# Patient Record
Sex: Male | Born: 1950
Health system: Southern US, Community
[De-identification: ages and names within clinical notes are randomized; demographics above are authoritative.]

## PROBLEM LIST (undated history)

## (undated) DIAGNOSIS — E119 Type 2 diabetes mellitus without complications: Secondary | ICD-10-CM

## (undated) DIAGNOSIS — R51 Headache: Secondary | ICD-10-CM

## (undated) DIAGNOSIS — Z9889 Other specified postprocedural states: Secondary | ICD-10-CM

## (undated) DIAGNOSIS — G4733 Obstructive sleep apnea (adult) (pediatric): Secondary | ICD-10-CM

## (undated) DIAGNOSIS — E785 Hyperlipidemia, unspecified: Secondary | ICD-10-CM

## (undated) DIAGNOSIS — L409 Psoriasis, unspecified: Secondary | ICD-10-CM

## (undated) DIAGNOSIS — H409 Unspecified glaucoma: Secondary | ICD-10-CM

## (undated) DIAGNOSIS — I2699 Other pulmonary embolism without acute cor pulmonale: Secondary | ICD-10-CM

## (undated) DIAGNOSIS — R253 Fasciculation: Secondary | ICD-10-CM

## (undated) DIAGNOSIS — K5790 Diverticulosis of intestine, part unspecified, without perforation or abscess without bleeding: Secondary | ICD-10-CM

## (undated) DIAGNOSIS — R519 Headache, unspecified: Secondary | ICD-10-CM

## (undated) DIAGNOSIS — Z9989 Dependence on other enabling machines and devices: Secondary | ICD-10-CM

## (undated) DIAGNOSIS — I10 Essential (primary) hypertension: Secondary | ICD-10-CM

## (undated) DIAGNOSIS — K219 Gastro-esophageal reflux disease without esophagitis: Secondary | ICD-10-CM

## (undated) DIAGNOSIS — I2510 Atherosclerotic heart disease of native coronary artery without angina pectoris: Secondary | ICD-10-CM

## (undated) HISTORY — DX: Other pulmonary embolism without acute cor pulmonale: I26.99

## (undated) HISTORY — DX: Headache: R51

## (undated) HISTORY — DX: Hyperlipidemia, unspecified: E78.5

## (undated) HISTORY — DX: Obstructive sleep apnea (adult) (pediatric): Z99.89

## (undated) HISTORY — DX: Psoriasis, unspecified: L40.9

## (undated) HISTORY — DX: Headache, unspecified: R51.9

## (undated) HISTORY — DX: Obstructive sleep apnea (adult) (pediatric): G47.33

## (undated) HISTORY — DX: Diverticulosis of intestine, part unspecified, without perforation or abscess without bleeding: K57.90

## (undated) HISTORY — DX: Other specified postprocedural states: Z98.890

## (undated) HISTORY — DX: Gastro-esophageal reflux disease without esophagitis: K21.9

## (undated) HISTORY — PX: APPENDECTOMY: SHX54

## (undated) HISTORY — DX: Fasciculation: R25.3

## (undated) HISTORY — DX: Atherosclerotic heart disease of native coronary artery without angina pectoris: I25.10

## (undated) HISTORY — DX: Essential (primary) hypertension: I10

## (undated) HISTORY — DX: Unspecified glaucoma: H40.9

## (undated) HISTORY — DX: Type 2 diabetes mellitus without complications: E11.9

---

## 1980-09-15 HISTORY — PX: SPINE SURGERY: SHX786

## 2004-09-15 HISTORY — PX: HERNIA REPAIR: SHX51

## 2005-06-03 ENCOUNTER — Emergency Department (HOSPITAL_COMMUNITY): Admission: EM | Admit: 2005-06-03 | Discharge: 2005-06-04 | Payer: Self-pay | Admitting: Emergency Medicine

## 2005-09-15 HISTORY — PX: COLON SURGERY: SHX602

## 2013-03-02 DIAGNOSIS — D490 Neoplasm of unspecified behavior of digestive system: Secondary | ICD-10-CM | POA: Insufficient documentation

## 2013-03-02 DIAGNOSIS — E785 Hyperlipidemia, unspecified: Secondary | ICD-10-CM | POA: Insufficient documentation

## 2013-03-02 DIAGNOSIS — R7301 Impaired fasting glucose: Secondary | ICD-10-CM | POA: Insufficient documentation

## 2013-03-02 DIAGNOSIS — Z8042 Family history of malignant neoplasm of prostate: Secondary | ICD-10-CM | POA: Insufficient documentation

## 2013-03-02 DIAGNOSIS — R252 Cramp and spasm: Secondary | ICD-10-CM | POA: Insufficient documentation

## 2013-03-02 DIAGNOSIS — L408 Other psoriasis: Secondary | ICD-10-CM | POA: Insufficient documentation

## 2013-03-28 ENCOUNTER — Encounter: Payer: Self-pay | Admitting: Neurology

## 2013-03-28 ENCOUNTER — Ambulatory Visit (INDEPENDENT_AMBULATORY_CARE_PROVIDER_SITE_OTHER): Payer: BC Managed Care – PPO | Admitting: Neurology

## 2013-03-28 VITALS — BP 139/83 | HR 59 | Ht 70.25 in | Wt 218.0 lb

## 2013-03-28 DIAGNOSIS — R252 Cramp and spasm: Secondary | ICD-10-CM

## 2013-03-28 DIAGNOSIS — G709 Myoneural disorder, unspecified: Secondary | ICD-10-CM

## 2013-03-28 DIAGNOSIS — L408 Other psoriasis: Secondary | ICD-10-CM

## 2013-03-28 DIAGNOSIS — R7301 Impaired fasting glucose: Secondary | ICD-10-CM

## 2013-03-28 DIAGNOSIS — G4730 Sleep apnea, unspecified: Secondary | ICD-10-CM

## 2013-03-28 DIAGNOSIS — R253 Fasciculation: Secondary | ICD-10-CM

## 2013-03-28 DIAGNOSIS — E785 Hyperlipidemia, unspecified: Secondary | ICD-10-CM

## 2013-03-28 DIAGNOSIS — I10 Essential (primary) hypertension: Secondary | ICD-10-CM

## 2013-03-28 DIAGNOSIS — Z8042 Family history of malignant neoplasm of prostate: Secondary | ICD-10-CM

## 2013-03-28 DIAGNOSIS — D490 Neoplasm of unspecified behavior of digestive system: Secondary | ICD-10-CM

## 2013-03-28 HISTORY — DX: Fasciculation: R25.3
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
## 2013-03-29 ENCOUNTER — Telehealth: Payer: Self-pay | Admitting: Neurology

## 2013-03-29 LAB — MAGNESIUM: Magnesium: 2.2 mg/dL (ref 1.6–2.6)

## 2013-03-29 LAB — GAD-65 AUTOANTIBODY: Glutamic Acid Decarb Ab: <1 U/mL (ref 0.0–1.5)

## 2013-03-29 LAB — LYME, TOTAL AB TEST/REFLEX: Lyme Ab: <0.91 index (ref 0.00–0.90)

## 2013-03-29 LAB — CK: Total CK: 219 U/L — ABNORMAL HIGH (ref 24–204)

## 2013-03-29 LAB — ANGIOTENSIN CONVERTING ENZYME: Angio Convert Enzyme: 14 U/L (ref 14–82)

## 2013-03-29 NOTE — Telephone Encounter (Signed)
I called patient. The blood work is unremarkable with exception of a very minimal elevation of CK enzyme levels. The patient has been set up for EMG and nerve conduction study.

## 2013-04-12 ENCOUNTER — Encounter (INDEPENDENT_AMBULATORY_CARE_PROVIDER_SITE_OTHER): Payer: BC Managed Care – PPO

## 2013-04-12 ENCOUNTER — Ambulatory Visit (INDEPENDENT_AMBULATORY_CARE_PROVIDER_SITE_OTHER): Payer: BC Managed Care – PPO | Admitting: Neurology

## 2013-04-12 DIAGNOSIS — R253 Fasciculation: Secondary | ICD-10-CM

## 2013-04-12 DIAGNOSIS — G709 Myoneural disorder, unspecified: Secondary | ICD-10-CM

## 2013-04-12 DIAGNOSIS — Z0289 Encounter for other administrative examinations: Secondary | ICD-10-CM

## 2013-04-12 MED ORDER — BACLOFEN 10 MG PO TABS: 10.0000 mg | ORAL_TABLET | Freq: Every day | ORAL | Status: DC

## 2013-04-12 NOTE — Progress Notes (Signed)
The patient comes in today for EMG and nerve conduction study. This reveals evidence of a right carpal tunnel syndrome, no evidence of muscle abnormalities. The patient had ongoing cramps day and night, worse at night. The patient will start baclofen at 10 mg at night. We will readjust the dose depending on his needs. The patient will followup in 3 or 4 months.

## 2013-04-12 NOTE — Procedures (Signed)
HISTORY:  Corey Gill is a 62 year old gentleman with a history of muscle cramps dating back about 3 years affecting the legs greater than the arms and chest area. The cramps are daily in nature, worse at night. The patient has also noted fasciculations of the muscle, primarily in the calf muscles. The patient is being evaluated for these symptoms.  NERVE CONDUCTION STUDIES:  Nerve conduction studies were performed on both upper extremities and on the right lower extremity. The distal motor latencies for the median nerves were prolonged on the right, normal on the left, with normal nerve conduction velocities for these nerves. The distal motor latency and motor amplitudes for the right ulnar nerve was normal. The F wave latencies and nerve conduction velocities for the median nerves bilaterally and for the right ulnar nerve were normal. The sensory latencies for the median nerves were prolonged on the right, normal on the left, and normal for the right ulnar nerve.  Nerve conduction studies were performed on the right lower extremity. The distal motor latencies and motor amplitudes for the peroneal and posterior tibial nerves were within normal limits. The nerve conduction velocities for these nerves were also normal. The H reflex latency was normal. The sensory latency for the peroneal nerve was within normal limits.   EMG STUDIES:  EMG study was performed on the right upper extremity:  The first dorsal interosseous muscle reveals 2 to 5 K units with full recruitment. No fibrillations or positive waves were noted. The abductor pollicis brevis muscle reveals 2 to 5 K units with decreased recruitment. No fibrillations or positive waves were noted. The extensor indicis proprius muscle reveals 1 to 3 K units with full recruitment. No fibrillations or positive waves were noted. The pronator teres muscle reveals 2 to 3 K units with full recruitment. No fibrillations or positive waves were  noted. The biceps muscle reveals 1 to 2 K units with full recruitment. No fibrillations or positive waves were noted. The triceps muscle reveals 2 to 3 K units with full recruitment. No fibrillations or positive waves were noted. The anterior deltoid muscle reveals 2 to 3 K units with full recruitment. No fibrillations or positive waves were noted. The cervical paraspinal muscles were tested at 2 levels. No abnormalities of insertional activity were seen at either level tested. There was fair relaxation.  EMG study was performed on the right lower extremity:  The tibialis anterior muscle reveals 2 to 4K motor units with full recruitment. No fibrillations or positive waves were seen. The peroneus tertius muscle reveals 2 to 4K motor units with full recruitment. No fibrillations or positive waves were seen. The medial gastrocnemius muscle reveals 1 to 3K motor units with full recruitment. No fibrillations or positive waves were seen. The vastus lateralis muscle reveals 2 to 4K motor units with full recruitment. No fibrillations or positive waves were seen. The iliopsoas muscle reveals 2 to 4K motor units with full recruitment. No fibrillations or positive waves were seen. The biceps femoris muscle (long head) reveals 2 to 4K motor units with full recruitment. No fibrillations or positive waves were seen. The lumbosacral paraspinal muscles were tested at 3 levels, and revealed no abnormalities of insertional activity at all 3 levels tested. There was good relaxation.   IMPRESSION:  Nerve conduction studies done on both upper extremities and on the right lower extremity revealed evidence of a mild right carpal tunnel syndrome. There is no evidence of an underlying peripheral neuropathy. EMG evaluation of the right upper and  right lower extremities were unremarkable, without evidence of a cervical or a lumbosacral radiculopathy on the right. No evidence of intrinsic muscle disease is seen.  Fasciculations were not seen in any muscle tested.  Marlan Palau MD 04/12/2013 1:41 PM  [facility] [address] [place], [place] [address]  Phone [phone] Fax [phone]

## 2013-08-21 ENCOUNTER — Other Ambulatory Visit: Payer: Self-pay | Admitting: Neurology

## 2013-08-27 ENCOUNTER — Encounter: Payer: Self-pay | Admitting: Cardiology

## 2013-08-29 ENCOUNTER — Ambulatory Visit (INDEPENDENT_AMBULATORY_CARE_PROVIDER_SITE_OTHER): Payer: BC Managed Care – PPO | Admitting: Cardiology

## 2013-08-29 ENCOUNTER — Encounter: Payer: Self-pay | Admitting: Cardiology

## 2013-08-29 VITALS — BP 148/79 | HR 70 | Ht 70.0 in | Wt 222.0 lb

## 2013-08-29 DIAGNOSIS — I10 Essential (primary) hypertension: Secondary | ICD-10-CM | POA: Insufficient documentation

## 2013-08-29 DIAGNOSIS — G4733 Obstructive sleep apnea (adult) (pediatric): Secondary | ICD-10-CM

## 2013-08-29 DIAGNOSIS — Z9989 Dependence on other enabling machines and devices: Secondary | ICD-10-CM

## 2013-08-29 DIAGNOSIS — E669 Obesity, unspecified: Secondary | ICD-10-CM | POA: Insufficient documentation

## 2013-08-29 NOTE — Patient Instructions (Signed)
Your physician recommends that you continue on your current medications as directed. Please refer to the Current Medication list given to you today.  Your physician wants you to follow-up in: 6 Months with Dr Turner You will receive a reminder letter in the mail two months in advance. If you don't receive a letter, please call our office to schedule the follow-up appointment.  

## 2013-08-29 NOTE — Progress Notes (Signed)
[address] [place], [place]  [address] Phone: [phone] Fax:  [phone]  Date:  08/29/2013   ID:  Corey Gill, DOB October 14, 1950, MRN [id number]  PCP:  Mickie Hillier, MD  Sleep Medicine:  Armanda Magic, MD   History of Present Illness: Corey Gill is a 62 y.o. male with a history of OSA, HTN and obesity presents today for followup.  He is doing well.  He tolerates his device without problems.  He tolerates his mask and feels with pressure is adequate.  He feels rested in the am and has some afternoon sleepiness.  He does not get any aerobic exercise.   Wt Readings from Last 3 Encounters:  08/29/13 222 lb (100.699 kg)  03/28/13 218 lb (98.884 kg)  03/28/13 219 lb 12.8 oz (99.701 kg)     Past Medical History  Diagnosis Date  . GERD (gastroesophageal reflux disease)   . Diverticulosis   . Headache   . Psoriasis   . OSA on CPAP   . History of colonoscopy   . 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 Glaucoma     Bilateral  . Essential hypertension     Current Outpatient Prescriptions  Medication Sig Dispense Refill  . acetaminophen (TYLENOL) 500 MG tablet Take 500 mg by mouth every 6 (six) hours as needed for pain.      Marland Kitchen amLODipine (NORVASC) 2.5 MG tablet Take 2.5 mg by mouth daily.      Marland Kitchen augmented betamethasone dipropionate (DIPROLENE-AF) 0.05 % cream Apply topically daily.      . baclofen (LIORESAL) 10 MG tablet TAKE 1 TABLET BY MOUTH AT BEDTIME  30 tablet  1  . lisinopril (PRINIVIL,ZESTRIL) 40 MG tablet Take 40 mg by mouth daily.      Marland Kitchen LUMIGAN 0.01 % SOLN Place 0.01 drops into both eyes daily.      . Multiple Vitamin (MULTIVITAMIN) tablet Take 1 tablet by mouth daily.      Marland Kitchen omeprazole (PRILOSEC) 20 MG capsule Take 20 mg by mouth daily.      . timolol (TIMOPTIC) 0.5 % ophthalmic solution Place 0.5 drops into both eyes daily.        No current facility-administered medications for this visit.    Allergies:    Allergies  Allergen  Reactions  . Asa [Aspirin] Swelling    Social History:  The patient  reports that he quit smoking about 19 years ago. He does not have any smokeless tobacco history on file. He reports that he does not drink alcohol or use illicit drugs.   Family History:  The patient's family history includes Angina in his sister; Colon polyps in his mother; GER disease in his mother; Prostate cancer in his brother and father; Skin cancer in his mother.   ROS:  Please see the history of present illness.      All other systems reviewed and negative.   PHYSICAL EXAM: VS:  BP 148/79  Pulse 70  Ht 5\' 10"  (1.778 m)  Wt 222 lb (100.699 kg)  BMI 31.85 kg/m2 Well nourished, well developed, in no acute distress HEENT: normal Neck: no JVD Cardiac:  normal S1, S2; RRR; no murmur Lungs:  clear to auscultation bilaterally, no wheezing, rhonchi or rales Abd: soft, nontender, no hepatomegaly Ext: no edema Skin: warm and dry Neuro:  CNs 2-12 intact, no focal abnormalities noted       ASSESSMENT AND PLAN:  1. OSA on CPAP therapy - download today showed an AHI of  1.3/hr and 80% compliant in using more than 4 hours nightly.  He will continu eon 7cm H2O 2. HTN - slightly elevated - at home it runs 120-14096mmHg  - continue amlodipine/lisinopril 3. Obesity - I encouraged him to get into an aerobic exercise regimen  followup with me in 6 months  Signed, Armanda Magic, MD 08/29/2013 3:52 PM

## 2014-02-28 ENCOUNTER — Encounter: Payer: Self-pay | Admitting: Cardiology

## 2014-03-01 ENCOUNTER — Encounter: Payer: Self-pay | Admitting: Cardiology

## 2014-03-01 ENCOUNTER — Encounter (INDEPENDENT_AMBULATORY_CARE_PROVIDER_SITE_OTHER): Payer: Self-pay

## 2014-03-01 ENCOUNTER — Ambulatory Visit (INDEPENDENT_AMBULATORY_CARE_PROVIDER_SITE_OTHER): Payer: BC Managed Care – PPO | Admitting: Cardiology

## 2014-03-01 VITALS — BP 130/76 | HR 81 | Ht 70.0 in | Wt 224.8 lb

## 2014-03-01 DIAGNOSIS — R001 Bradycardia, unspecified: Secondary | ICD-10-CM | POA: Insufficient documentation

## 2014-03-01 DIAGNOSIS — I10 Essential (primary) hypertension: Secondary | ICD-10-CM

## 2014-03-01 DIAGNOSIS — E669 Obesity, unspecified: Secondary | ICD-10-CM

## 2014-03-01 DIAGNOSIS — G4733 Obstructive sleep apnea (adult) (pediatric): Secondary | ICD-10-CM

## 2014-03-01 DIAGNOSIS — Z9989 Dependence on other enabling machines and devices: Secondary | ICD-10-CM

## 2014-03-01 DIAGNOSIS — I498 Other specified cardiac arrhythmias: Secondary | ICD-10-CM

## 2014-03-01 NOTE — Patient Instructions (Addendum)
Your physician recommends that you continue on your current medications as directed. Please refer to the Current Medication list given to you today.  Your physician has recommended that you wear a holter monitor. Holter monitors are medical devices that record the heart's electrical activity. Doctors most often use these monitors to diagnose arrhythmias. Arrhythmias are problems with the speed or rhythm of the heartbeat. The monitor is a small, portable device. You can wear one while you do your normal daily activities. This is usually used to diagnose what is causing palpitations/syncope (passing out). (48 hour monitor)   Your physician wants you to follow-up in: 6 months with Dr Mallie Snooks will receive a reminder letter in the mail two months in advance. If you don't receive a letter, please call our office to schedule the follow-up appointment.

## 2014-03-01 NOTE — Progress Notes (Signed)
Corey Gill, [place], [place]  [address] Phone: [phone] Fax:  [phone]  Date:  03/01/2014   ID:  Corey Gill, DOB 05/13/1951, MRN [id number]  PCP:  Gennette Pac, MD  Cardiologist:  Fransico Him, MD     History of Present Illness: Corey Gill is a 63 y.o. male with a history of OSA, HTN and obesity presents today for followup. He is doing well. He tolerates his device without problems. He tolerates his mask and feels with pressure is adequate. He feels rested in the am and has some afternoon sleepiness. He does not get any aerobic exercise. He has been fatigued recently and has noticed that his HR has been occasionally running in the low 50's and upper 40's.   Wt Readings from Last 3 Encounters:  03/01/14 224 lb 12.8 oz (101.969 kg)  08/29/13 222 lb (100.699 kg)  03/28/13 218 lb (98.884 kg)     Past Medical History  Diagnosis Date  . GERD (gastroesophageal reflux disease)   . Diverticulosis   . Headache   . Psoriasis   . OSA on CPAP   . History of colonoscopy   . 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 Glaucoma     Bilateral  . Essential hypertension     Current Outpatient Prescriptions  Medication Sig Dispense Refill  . acetaminophen (TYLENOL) 500 MG tablet Take 500 mg by mouth every 6 (six) hours as needed for pain.      . ALPHAGAN P 0.1 % SOLN       . amLODipine (NORVASC) 2.5 MG tablet Take 2.5 mg by mouth daily.      Marland Kitchen augmented betamethasone dipropionate (DIPROLENE-AF) 0.05 % cream Apply topically daily.      Marland Kitchen lisinopril (PRINIVIL,ZESTRIL) 40 MG tablet Take 40 mg by mouth daily.      Marland Kitchen LUMIGAN 0.01 % SOLN Place 0.01 drops into both eyes daily.      Marland Kitchen omeprazole (PRILOSEC) 20 MG capsule Take 20 mg by mouth daily.       No current facility-administered medications for this visit.    Allergies:    Allergies  Allergen Reactions  . Asa [Aspirin] Swelling    Social History:  The patient  reports that he quit smoking  about 20 years ago. He does not have any smokeless tobacco history on file. He reports that he does not drink alcohol or use illicit drugs.   Family History:  The patient's family history includes Angina in his sister; Colon polyps in his mother; GER disease in his mother; Prostate cancer in his brother and father; Skin cancer in his mother.   ROS:  Please see the history of present illness.      All other systems reviewed and negative.   PHYSICAL EXAM: VS:  BP 130/76  Pulse 81  Ht 5\' 10"  (1.778 m)  Wt 224 lb 12.8 oz (101.969 kg)  BMI 32.26 kg/m2 Well nourished, well developed, in no acute distress HEENT: normal Neck: no JVD Cardiac:  normal S1, S2; RRR; no murmur Lungs:  clear to auscultation bilaterally, no wheezing, rhonchi or rales Abd: soft, nontender, no hepatomegaly Ext: no edema Skin: warm and dry Neuro:  CNs 2-12 intact, no focal abnormalities noted  ASSESSMENT AND PLAN:  1. OSA on CPAP therapy - download today showed an AHI of 1.1/hr and 69% compliant in using more than 4 hours nightly. He will continue on 7cm H2O 2. HTN - slightly elevated - at home it runs 120-14024mmHg -  continue amlodipine/lisinopril  3. Obesity - I encouraged him to get into an aerobic exercise regimen followup with me in 6 months       4.  Bradycardia - his HR is fine today but given his intermittent fatigue with intermittent low HR will assess with 48 hour Holter monitor  Followup with me in 6 months   Signed, Fransico Him, MD 03/01/2014 3:25 PM

## 2014-03-07 ENCOUNTER — Encounter: Payer: Self-pay | Admitting: *Deleted

## 2014-03-07 ENCOUNTER — Encounter (INDEPENDENT_AMBULATORY_CARE_PROVIDER_SITE_OTHER): Payer: BC Managed Care – PPO

## 2014-03-07 DIAGNOSIS — I498 Other specified cardiac arrhythmias: Secondary | ICD-10-CM

## 2014-03-07 DIAGNOSIS — R001 Bradycardia, unspecified: Secondary | ICD-10-CM

## 2014-03-07 NOTE — Progress Notes (Signed)
Patient ID: Corey Gill, male   DOB: [birth year], 63 y.o.   MRN: [id number] E-Cardio 48 hour holter monitor applied to patient.

## 2014-03-13 ENCOUNTER — Telehealth: Payer: Self-pay | Admitting: Cardiology

## 2014-03-13 NOTE — Telephone Encounter (Signed)
Pt is aware.  

## 2014-03-13 NOTE — Telephone Encounter (Signed)
Please let patient know that heart monitor showed NSR with occasional extra beats from the top of her heart which are normal.

## 2014-05-30 ENCOUNTER — Encounter: Payer: Self-pay | Admitting: Cardiology

## 2014-06-22 ENCOUNTER — Encounter: Payer: Self-pay | Admitting: Cardiology

## 2014-06-23 ENCOUNTER — Encounter: Payer: Self-pay | Admitting: Cardiology

## 2014-08-28 ENCOUNTER — Ambulatory Visit (INDEPENDENT_AMBULATORY_CARE_PROVIDER_SITE_OTHER): Payer: BC Managed Care – PPO | Admitting: Cardiology

## 2014-08-28 ENCOUNTER — Encounter: Payer: Self-pay | Admitting: Cardiology

## 2014-08-28 VITALS — BP 124/72 | HR 70 | Ht 70.0 in | Wt 224.2 lb

## 2014-08-28 DIAGNOSIS — G4733 Obstructive sleep apnea (adult) (pediatric): Secondary | ICD-10-CM

## 2014-08-28 DIAGNOSIS — Z9989 Dependence on other enabling machines and devices: Secondary | ICD-10-CM

## 2014-08-28 DIAGNOSIS — E669 Obesity, unspecified: Secondary | ICD-10-CM

## 2014-08-28 DIAGNOSIS — I10 Essential (primary) hypertension: Secondary | ICD-10-CM

## 2014-08-28 NOTE — Progress Notes (Signed)
[address] [place], [place]  [address] Phone: [phone] Fax:  [phone]  Date:  08/28/2014   ID:  Corey Gill, DOB 1950/10/24, MRN [id number]  PCP:  Gennette Pac, MD  Cardiologist:  Fransico Him, MD    History of Present Illness: Corey Gill is a 63 y.o. male with a history of OSA, HTN and obesity presents today for followup. He is doing well. He tolerates his device without problems. He tolerates his nasal pillow mask and feels with pressure is adequate. He feels rested in the am but does feel sleepy later in the day.  He goes to bed anywhere from 11pm to 1am. He does not get much aerobic exercise.   Wt Readings from Last 3 Encounters:  08/28/14 224 lb 3.2 oz (101.696 kg)  03/01/14 224 lb 12.8 oz (101.969 kg)  08/29/13 222 lb (100.699 kg)     Past Medical History  Diagnosis Date  . GERD (gastroesophageal reflux disease)   . Diverticulosis   . Headache   . Psoriasis   . OSA on CPAP   . History of colonoscopy   . 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 Glaucoma     Bilateral  . Essential hypertension     Current Outpatient Prescriptions  Medication Sig Dispense Refill  . acetaminophen (TYLENOL) 500 MG tablet Take 500 mg by mouth every 8 (eight) hours as needed (migraines). Pt states he takes this just a few times a year    . ALPHAGAN P 0.1 % SOLN Place 1 drop into both eyes 2 (two) times daily.     Marland Kitchen amLODipine (NORVASC) 2.5 MG tablet Take 2.5 mg by mouth daily.    Marland Kitchen augmented betamethasone dipropionate (DIPROLENE-AF) 0.05 % cream Apply 1 application topically daily.     Marland Kitchen lisinopril (PRINIVIL,ZESTRIL) 40 MG tablet Take 40 mg by mouth daily.    Marland Kitchen LUMIGAN 0.01 % SOLN Place 1 drop into both eyes at bedtime.     Marland Kitchen omeprazole (PRILOSEC) 20 MG capsule Take 20 mg by mouth daily.     No current facility-administered medications for this visit.    Allergies:    Allergies  Allergen Reactions  . Rosuvastatin Other (See Comments)   Tired and severe muscle aches  . Asa [Aspirin] Swelling    Social History:  The patient  reports that he quit smoking about 20 years ago. He does not have any smokeless tobacco history on file. He reports that he does not drink alcohol or use illicit drugs.   Family History:  The patient's family history includes Angina in his sister; Colon polyps in his mother; GER disease in his mother; Prostate cancer in his brother and father; Skin cancer in his mother.   ROS:  Please see the history of present illness.      All other systems reviewed and negative.   PHYSICAL EXAM: VS:  BP 124/72 mmHg  Pulse 70  Ht 5\' 10"  (1.778 m)  Wt 224 lb 3.2 oz (101.696 kg)  BMI 32.17 kg/m2 Well nourished, well developed, in no acute distress HEENT: normal Neck: no JVD Cardiac:  normal S1, S2; RRR; no murmur Lungs:  clear to auscultation bilaterally, no wheezing, rhonchi or rales Abd: soft, nontender, no hepatomegaly Ext: no edema Skin: warm and dry Neuro:  CNs 2-12 intact, no focal abnormalities noted  ASSESSMENT AND PLAN:  1. OSA on CPAP therapy - download today showed an AHI of 1.4/hr and 87% compliant in using more than 4 hours  nightly. He will continue on 7cm H2O 2. HTN - controlled - continue amlodipine/lisinopril  3. Obesity - I encouraged him to get into an aerobic exercise regimen followup with me in 6 months  Followup with me in 6 months        Signed, Fransico Him, MD [facility] 08/28/2014 9:24 AM

## 2014-08-28 NOTE — Patient Instructions (Signed)
Your physician recommends that you continue on your current medications as directed. Please refer to the Current Medication list given to you today.  Your physician wants you to follow-up in: 6 months with Dr Turner You will receive a reminder letter in the mail two months in advance. If you don't receive a letter, please call our office to schedule the follow-up appointment.  

## 2014-11-21 ENCOUNTER — Encounter: Payer: Self-pay | Admitting: *Deleted

## 2014-11-21 ENCOUNTER — Encounter: Payer: BLUE CROSS/BLUE SHIELD | Attending: Family Medicine | Admitting: *Deleted

## 2014-11-21 VITALS — Ht 70.0 in | Wt 217.1 lb

## 2014-11-21 DIAGNOSIS — Z713 Dietary counseling and surveillance: Secondary | ICD-10-CM | POA: Insufficient documentation

## 2014-11-21 DIAGNOSIS — E119 Type 2 diabetes mellitus without complications: Secondary | ICD-10-CM | POA: Diagnosis not present

## 2014-11-21 NOTE — Patient Instructions (Signed)
Plan:  Aim for 3 Carb Choices per meal (45 grams) +/- 1 either way  Aim for 0-15 Carbs per snack if hungry  Include protein in moderation with your meals and snacks Consider reading food labels for Total Carbohydrate and Fat Grams of foods Consider  increasing your activity level by walking for 30 minutes daily as tolerated Continue taking medication as directed by MD  Consider Lynnae Sandhoff 45-50 calorie bread ( 9-10g carbs)  Always have three meals per day, consider snacks Take Metformin in the middle of your breakfast

## 2014-11-23 ENCOUNTER — Encounter: Payer: Self-pay | Admitting: Cardiology

## 2014-11-23 ENCOUNTER — Encounter: Payer: Self-pay | Admitting: *Deleted

## 2014-11-23 NOTE — Progress Notes (Signed)
Diabetes Self-Management Education  Visit Type:   Initial DSME  Appt. Start Time: 1400 Appt. End Time: 1600  11/23/2014  Mr. Sahan Pen, identified by name and date of birth, is a 64 y.o. male with a diagnosis of Diabetes: Type 2.  Other people present during visit:  Spouse/SO Birder Robson) Mr and Mrs Conover present for initial DSME. Mr. Esther Hardy has a flat affect and hangs his head. He is most disturbed with the new diagnosis of T2DM. He notes he does not adjust to change well.  ASSESSMENT  Height 5\' 10"  (1.778 m), weight 217 lb 1.6 oz (98.476 kg). Body mass index is 31.15 kg/(m^2).  Initial Visit Information:  Are you currently following a meal plan?: No Are you taking your medications as prescribed?: Yes Are you checking your feet?: No How often do you need to have someone help you when you read instructions, pamphlets, or other written materials from your doctor or pharmacy?: 1 - Never   Psychosocial:   Patient Belief/Attitude about Diabetes: Motivated to manage diabetes Self-care barriers: None Self-management support: Doctor's office, Family, CDE visits Other persons present: Spouse/SO (Allene) Patient Concerns: Nutrition/Meal planning, Medication, Healthy Lifestyle, Weight Control Special Needs: None Preferred Learning Style: No preference indicated Learning Readiness: Change in progress  Complications:   Last HgB A1C per patient/outside source: 9.2 mg/dL How often do you check your blood sugar?: Not recommended by provider Have you had a dilated eye exam in the past 12 months?: Yes Have you had a dental exam in the past 12 months?: No  Diet Intake:  Breakfast: 1C Special K cereal, banana, 1% milk / 2 scrambled eggs, 2 bacon, 1-2 toast Lunch: skip lunch 3-4 times per week / 2 hot dogs water, chili & mustard Snack (afternoon): little debbie raisin cake Dinner: broiled talapia, vegetable soup, salad (tomatoe, greens, ranch dressing),  Snack (evening): little debbie  raisin cake (only one per day) Beverage(s): coffee, sweet tea (sugar)  Exercise:  Exercise: ADL's  Individualized Plan for Diabetes Self-Management Training:   Learning Objective:  Patient will have a greater understanding of diabetes self-management. Patient education plan per assessed needs and concerns is to attend individual sessions      Education Topics Reviewed with Patient Today:  Definition of diabetes, type 1 and 2, and the diagnosis of diabetes, Factors that contribute to the development of diabetes, Explored patient's options for treatment of their diabetes Role of exercise on diabetes management, blood pressure control and cardiac health. Reviewed patients medication for diabetes, action, purpose, timing of dose and side effects. Relationship between chronic complications and blood glucose control, Assessed and discussed foot care and prevention of foot problems, Dental care  PATIENTS GOALS/Plan (Developed by the patient):  Nutrition: General guidelines for healthy choices and portions discussed Physical Activity: Exercise 3-5 times per week Medications: take my medication as prescribed Reducing Risk: do foot checks daily   Patient Instructions  Plan:  Aim for 3 Carb Choices per meal (45 grams) +/- 1 either way  Aim for 0-15 Carbs per snack if hungry  Include protein in moderation with your meals and snacks Consider reading food labels for Total Carbohydrate and Fat Grams of foods Consider  increasing your activity level by walking for 30 minutes daily as tolerated Continue taking medication as directed by MD  Consider Lynnae Sandhoff 45-50 calorie bread ( 9-10g carbs)  Always have three meals per day, consider snacks Take Metformin in the middle of your breakfast  Expected Outcomes:  Demonstrated interest in learning.  Expect positive outcomes  Education material provided: Living Well with Diabetes, Food label handouts, A1C conversion sheet, Meal plan card, My Plate  and Snack sheet  If problems or questions, patient to contact team via:  Phone  Future DSME appointment: 4-6 wks

## 2015-02-23 ENCOUNTER — Ambulatory Visit: Payer: BLUE CROSS/BLUE SHIELD | Admitting: Cardiology

## 2015-03-16 ENCOUNTER — Telehealth: Payer: Self-pay | Admitting: Cardiology

## 2015-03-21 ENCOUNTER — Ambulatory Visit: Payer: BLUE CROSS/BLUE SHIELD | Admitting: Nurse Practitioner

## 2015-04-11 ENCOUNTER — Ambulatory Visit: Payer: BLUE CROSS/BLUE SHIELD | Admitting: Cardiology

## 2015-05-24 ENCOUNTER — Ambulatory Visit (INDEPENDENT_AMBULATORY_CARE_PROVIDER_SITE_OTHER): Payer: BLUE CROSS/BLUE SHIELD | Admitting: Cardiology

## 2015-05-24 ENCOUNTER — Encounter: Payer: Self-pay | Admitting: Cardiology

## 2015-05-24 VITALS — BP 120/58 | HR 77 | Ht 70.0 in | Wt 200.0 lb

## 2015-05-24 DIAGNOSIS — G4733 Obstructive sleep apnea (adult) (pediatric): Secondary | ICD-10-CM | POA: Diagnosis not present

## 2015-05-24 DIAGNOSIS — E669 Obesity, unspecified: Secondary | ICD-10-CM | POA: Diagnosis not present

## 2015-05-24 DIAGNOSIS — Z9989 Dependence on other enabling machines and devices: Secondary | ICD-10-CM

## 2015-05-24 DIAGNOSIS — I10 Essential (primary) hypertension: Secondary | ICD-10-CM
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
## 2015-05-24 NOTE — Patient Instructions (Signed)

## 2015-06-12 ENCOUNTER — Encounter: Payer: Self-pay | Admitting: Cardiology

## 2015-09-06 NOTE — Telephone Encounter (Signed)
error 

## 2016-05-16 ENCOUNTER — Encounter: Payer: Self-pay | Admitting: Cardiology

## 2016-05-28 ENCOUNTER — Encounter: Payer: Self-pay | Admitting: Cardiology
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
## 2016-06-01 ENCOUNTER — Encounter: Payer: Self-pay | Admitting: Cardiology

## 2016-06-02 ENCOUNTER — Encounter: Payer: Self-pay | Admitting: Cardiology

## 2016-06-02 ENCOUNTER — Encounter (INDEPENDENT_AMBULATORY_CARE_PROVIDER_SITE_OTHER): Payer: Self-pay

## 2016-06-02 ENCOUNTER — Ambulatory Visit (INDEPENDENT_AMBULATORY_CARE_PROVIDER_SITE_OTHER): Payer: BLUE CROSS/BLUE SHIELD | Admitting: Cardiology

## 2016-06-02 VITALS — BP 140/70 | HR 65 | Ht 70.0 in | Wt 210.4 lb

## 2016-06-02 DIAGNOSIS — G4733 Obstructive sleep apnea (adult) (pediatric): Secondary | ICD-10-CM

## 2016-06-02 DIAGNOSIS — E669 Obesity, unspecified: Secondary | ICD-10-CM

## 2016-06-02 DIAGNOSIS — I10 Essential (primary) hypertension: Secondary | ICD-10-CM

## 2016-06-02 DIAGNOSIS — Z9989 Dependence on other enabling machines and devices: Secondary | ICD-10-CM

## 2016-06-02 NOTE — Patient Instructions (Signed)
Medication Instructions:  None  Labwork: None  Testing/Procedures: None  Follow-Up: Your physician wants you to follow-up in: 1 year with Dr. Turner. You will receive a reminder letter in the mail two months in advance. If you don't receive a letter, please call our office to schedule the follow-up appointment.   Any Other Special Instructions Will Be Listed Below (If Applicable).    If you need a refill on your cardiac medications before your next appointment, please call your pharmacy.   

## 2016-08-19 DIAGNOSIS — L400 Psoriasis vulgaris: Secondary | ICD-10-CM | POA: Diagnosis not present

## 2016-08-25 DIAGNOSIS — H401131 Primary open-angle glaucoma, bilateral, mild stage: Secondary | ICD-10-CM | POA: Diagnosis not present

## 2016-11-24 DIAGNOSIS — H401131 Primary open-angle glaucoma, bilateral, mild stage: Secondary | ICD-10-CM | POA: Diagnosis not present

## 2017-02-23 DIAGNOSIS — H401131 Primary open-angle glaucoma, bilateral, mild stage: Secondary | ICD-10-CM | POA: Diagnosis not present

## 2017-03-05 DIAGNOSIS — Z8042 Family history of malignant neoplasm of prostate: Secondary | ICD-10-CM | POA: Diagnosis not present

## 2017-03-05 DIAGNOSIS — I10 Essential (primary) hypertension: Secondary | ICD-10-CM | POA: Diagnosis not present

## 2017-03-05 DIAGNOSIS — E119 Type 2 diabetes mellitus without complications: Secondary | ICD-10-CM | POA: Diagnosis not present

## 2017-03-05 DIAGNOSIS — G4730 Sleep apnea, unspecified: Secondary | ICD-10-CM | POA: Diagnosis not present

## 2017-03-10 ENCOUNTER — Encounter: Payer: Self-pay | Admitting: Neurology
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
## 2017-03-24 ENCOUNTER — Encounter: Payer: Self-pay | Admitting: Neurology

## 2017-03-24 ENCOUNTER — Ambulatory Visit (INDEPENDENT_AMBULATORY_CARE_PROVIDER_SITE_OTHER): Payer: Medicare Other | Admitting: Neurology

## 2017-03-24 ENCOUNTER — Other Ambulatory Visit: Payer: Medicare Other

## 2017-03-24 VITALS — BP 134/74 | HR 56 | Ht 69.5 in | Wt 202.0 lb

## 2017-03-24 DIAGNOSIS — R270 Ataxia, unspecified: Secondary | ICD-10-CM | POA: Diagnosis not present

## 2017-03-24 DIAGNOSIS — R6889 Other general symptoms and signs: Secondary | ICD-10-CM | POA: Diagnosis not present

## 2017-03-24 DIAGNOSIS — M791 Myalgia, unspecified site: Secondary | ICD-10-CM

## 2017-03-24 DIAGNOSIS — M542 Cervicalgia: Secondary | ICD-10-CM

## 2017-03-24 DIAGNOSIS — Z9889 Other specified postprocedural states: Secondary | ICD-10-CM

## 2017-03-24 DIAGNOSIS — M6281 Muscle weakness (generalized): Secondary | ICD-10-CM

## 2017-03-24 DIAGNOSIS — R51 Headache: Secondary | ICD-10-CM

## 2017-03-24 DIAGNOSIS — R413 Other amnesia: Secondary | ICD-10-CM | POA: Diagnosis not present

## 2017-03-24 DIAGNOSIS — R292 Abnormal reflex: Secondary | ICD-10-CM | POA: Diagnosis not present

## 2017-03-24 DIAGNOSIS — R519 Headache, unspecified: Secondary | ICD-10-CM

## 2017-03-24 NOTE — Patient Instructions (Addendum)
1. We have sent a referral to [facility] for your MRI and they will call you directly to schedule your appt. They are located at [place]. If you need to contact them directly please call [phone].  2. Your provider has requested that you have labwork completed today. Please go to [facility] ([id number]) on the second floor of this building before leaving the office today. You do not need to check in. If you are not called within 15 minutes please check with the front desk.   3. We will schedule EEG.  Avoid activities in which a spell would cause danger to yourself or to others.  Don't operate dangerous machinery, swim alone, or climb in high or dangerous places, such as on ladders, roofs, or girders.  Do not drive.

## 2017-03-25 LAB — LYME AB/WESTERN BLOT REFLEX

## 2017-03-25 LAB — PHOSPHORUS: PHOSPHORUS: 3 mg/dL (ref 2.1–4.3)

## 2017-03-25 LAB — CK: Total CK: 194 U/L (ref 44–196)

## 2017-03-25 LAB — VITAMIN B12: VITAMIN B 12: 322 pg/mL (ref 200–1100)

## 2017-03-25 LAB — CALCIUM: CALCIUM: 9.2 mg/dL (ref 8.6–10.3)

## 2017-03-25 LAB — PARATHYROID HORMONE, INTACT (NO CA): PTH: 42 pg/mL (ref 14–64)

## 2017-03-26 ENCOUNTER — Telehealth: Payer: Self-pay | Admitting: Neurology

## 2017-03-26 LAB — HEAVY METALS PANEL, BLOOD
Arsenic: <3 mcg/L (ref <23)
Lead: <1 ug/dL (ref <5)
Mercury, B: <4 mcg/L (ref <=10)

## 2017-03-26 LAB — COPPER, SERUM: Copper: 79 ug/dL (ref 70–175)

## 2017-03-26 NOTE — Telephone Encounter (Signed)
Elberta Fortis with [employer] called to let us know they could not run urine specimen because this was received in a stockwell tube and not a urine specimen up.   Dr. Carles Collet Juluis Rainier.   [place].

## 2017-03-30 ENCOUNTER — Telehealth: Payer: Self-pay | Admitting: Neurology

## 2017-03-30 LAB — IMMUNOFIXATION ELECTROPHORESIS
IMMUNOGLOBULIN A: 171 mg/dL (ref 81–463)
IMMUNOGLOBULIN G FD: 843 mg/dL (ref 694–1618)
Immunofix Electr Int: NOT DETECTED
Immunoglobulin M: 83 mg/dL (ref 48–271)

## 2017-03-30 LAB — PROTEIN ELECTROPHORESIS, SERUM
ALPHA-2-GLOBULIN: 0.7 g/dL (ref 0.5–0.9)
Albumin ELP: 4.6 g/dL (ref 3.8–4.8)
Alpha-1-Globulin: 0.2 g/dL (ref 0.2–0.3)
BETA 2: 0.3 g/dL (ref 0.2–0.5)
BETA GLOBULIN: 0.5 g/dL (ref 0.4–0.6)
GAMMA GLOBULIN: 0.8 g/dL (ref 0.8–1.7)
Total Protein, Serum Electrophoresis: 7.2 g/dL (ref 6.1–8.1)

## 2017-03-30 NOTE — Telephone Encounter (Signed)
Dr. Carles Collet Juluis Rainier.   They will run protein electrophores/immuofixation on urine.

## 2017-03-30 NOTE — Telephone Encounter (Signed)
-----   Message from Eliott Nine sent at 03/27/2017  9:48 AM EDT ----- Regarding: Lab I just spoke with Mitchell Heir at [facility] she said the urine specimen that I sent in can be used, so she's going to contact the area that processes urines have them both  run test.  I did make [facility] aware that their customer service department told me to send the urines in the containers that I sent to them.  Sorry they had to send a message to you all.  I will call the patient back and let him know that he doesn't need to come back to the lab.

## 2017-03-31 LAB — PROTEIN ELECTROPHORESIS,RANDOM URN
Creatinine, Random, Ur: 99 mg/dL (ref 20–370)
Protein, Random Urine: 6 mg/dL (ref 5–25)
Protein/Creat Ratio: 61 mg/g creat (ref 22–128)

## 2017-03-31 LAB — IMMUNOFIXATION INTE

## 2017-04-01 ENCOUNTER — Ambulatory Visit (INDEPENDENT_AMBULATORY_CARE_PROVIDER_SITE_OTHER): Payer: Medicare Other | Admitting: Neurology

## 2017-04-01 DIAGNOSIS — R404 Transient alteration of awareness: Secondary | ICD-10-CM | POA: Diagnosis not present

## 2017-04-01 DIAGNOSIS — R6889 Other general symptoms and signs: Secondary | ICD-10-CM | POA: Diagnosis not present

## 2017-04-01 NOTE — Procedures (Signed)
TECHNICAL SUMMARY:  A multichannel referential and bipolar montage EEG using the standard international 10-20 system was performed on the patient described as awake, drowsy and asleep.  The dominant background activity consists of 9 hertz activity seen most prominantly over the posterior head region.  The backgound activity is reactive to eye opening and closing procedures.  Low voltage fast (beta) activity is distributed symmetrically and maximally over the anterior head regions.  ACTIVATION:  Stepwise photic stimulation at 4-20 flashes per second was performed and did not elicit any abnormal waveforms.  Hyperventilation was not performed.  EPILEPTIFORM ACTIVITY:  There were no spikes, sharp waves or paroxysmal activity.  SLEEP:  Stage I and stage II sleep architecture were identified.  CARDIAC:  The EKG lead revealed a regular sinus rhythm.  IMPRESSION:  This is a normal 1 hour EEG for the patients stated age.  There were no focal, hemispheric or lateralizing features.  No epileptiform activity was recorded.  A normal EEG does not exclude the diagnosis of a seizure disorder and if seizure remains high on the list of differential diagnosis, an ambulatory EEG may be of value.  Clinical correlation is required.

## 2017-04-02 ENCOUNTER — Telehealth: Payer: Self-pay | Admitting: Neurology

## 2017-04-02 NOTE — Telephone Encounter (Signed)
Mychart message sent to patient.

## 2017-04-02 NOTE — Telephone Encounter (Signed)
-----   Message from [place], DO sent at 04/01/2017  4:37 PM EDT ----- Let pt know that EEG looks good

## 2017-04-07 ENCOUNTER — Telehealth: Payer: Self-pay | Admitting: Neurology

## 2017-04-07 NOTE — Telephone Encounter (Signed)
Patient's lab work was not reported out within the system.  It did return.  Calcium was 9.2.  Phosphorus normal at 3.0.  CPK was normal at 194.  B12 was slightly low at 322.  Lyme was negative.  No Bence Jones proteins in urine.  PTH was 42, which was normal.  No monoclonal protein in the serum.  Her in the serum was normal.  Heavy metals were normal.  Jade, let pt know that labs ok.  B12 just a little low and can take supplement.

## 2017-04-07 NOTE — Telephone Encounter (Signed)
Patient made aware labs okay and to start b12 supplement.

## 2017-04-11 ENCOUNTER — Ambulatory Visit
Admission: RE | Admit: 2017-04-11 | Discharge: 2017-04-11 | Disposition: A | Discharge status: Home / Self Care | Payer: Medicare Other | Source: Ambulatory Visit | Attending: Neurology | Admitting: Neurology

## 2017-04-11 DIAGNOSIS — R51 Headache: Secondary | ICD-10-CM

## 2017-04-11 DIAGNOSIS — M6281 Muscle weakness (generalized): Secondary | ICD-10-CM

## 2017-04-11 DIAGNOSIS — M542 Cervicalgia: Secondary | ICD-10-CM

## 2017-04-11 DIAGNOSIS — M791 Myalgia, unspecified site: Secondary | ICD-10-CM

## 2017-04-11 DIAGNOSIS — R519 Headache, unspecified: Secondary | ICD-10-CM

## 2017-04-11 DIAGNOSIS — R270 Ataxia, unspecified: Secondary | ICD-10-CM

## 2017-04-11 DIAGNOSIS — Z9889 Other specified postprocedural states: Secondary | ICD-10-CM

## 2017-04-11 MED ORDER — GADOBENATE DIMEGLUMINE 529 MG/ML IV SOLN
20.0000 mL | Freq: Once | INTRAVENOUS | Status: AC | PRN
  Administered 2017-04-11: 18 mL via INTRAVENOUS

## 2017-04-13 ENCOUNTER — Telehealth: Payer: Self-pay | Admitting: Neurology

## 2017-04-13 DIAGNOSIS — R413 Other amnesia: Secondary | ICD-10-CM

## 2017-04-13 NOTE — Telephone Encounter (Signed)
Corey Gill called and said PT had 3 major tests done, PT is getting worse and wants to see Dr Tat as soon as possible, she does want a call back to discuss her concerns for PT, I did schedule him for August 16th but she said he can not wait that long

## 2017-04-13 NOTE — Telephone Encounter (Signed)
-----   Message from Cameron Sprang, MD sent at 04/13/2017  9:55 AM EDT ----- Pls let him know MRI brain did not show any evidence of tumor, stroke, or bleed. It showed changes seen in patients in blood pressure, cholesterol, diabetes, very important to continue control of these conditions. His MRI cervical spine showed a disc protrusion below the prior surgery, but only mild spinal stenosis. Further recommendations when Dr. Carles Collet returns, thanks

## 2017-04-13 NOTE — Telephone Encounter (Signed)
Patient made aware of results.   Dr. Carles Collet please advise on further recommendations.

## 2017-04-13 NOTE — Telephone Encounter (Signed)
PT left a message she was returning your call

## 2017-04-13 NOTE — Telephone Encounter (Signed)
Left message on machine for Corey Gill to call back.

## 2017-04-14 NOTE — Telephone Encounter (Signed)
Tried to call again with no answer  

## 2017-04-20 NOTE — Telephone Encounter (Signed)
Lets schedule EMG and neuropsych testing.  Explain to patient/wife what each test is.

## 2017-04-21 NOTE — Addendum Note (Signed)
Addended byAnnamaria Helling on: 04/21/2017 01:36 PM   Modules accepted: Orders

## 2017-04-21 NOTE — Telephone Encounter (Signed)
I will hold on ordering EMG until you see patient on 04/30/17 and can discuss at that time.

## 2017-04-21 NOTE — Telephone Encounter (Signed)
Spoke with patient about testing.   He is agreeable to Neurocognitive testing. Order entered.   He had EMG in 2014 and is very reluctant to have it done again. He states if he absolutely needs it he will.  Dr. Carles Collet please advise.

## 2017-04-21 NOTE — Telephone Encounter (Signed)
I really think that it would be a good idea.  He may need to see Dr. Posey Pronto as well.

## 2017-04-21 NOTE — Telephone Encounter (Signed)
Left message on machine for patient to call back.
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
## 2017-04-30 ENCOUNTER — Encounter: Payer: Self-pay | Admitting: Neurology

## 2017-04-30 ENCOUNTER — Other Ambulatory Visit: Payer: Medicare Other

## 2017-04-30 ENCOUNTER — Ambulatory Visit (INDEPENDENT_AMBULATORY_CARE_PROVIDER_SITE_OTHER): Payer: Medicare Other | Admitting: Neurology

## 2017-04-30 ENCOUNTER — Telehealth: Payer: Self-pay | Admitting: Neurology

## 2017-04-30 VITALS — BP 136/80 | HR 70 | Ht 69.5 in | Wt 204.0 lb

## 2017-04-30 DIAGNOSIS — R2681 Unsteadiness on feet: Secondary | ICD-10-CM | POA: Diagnosis not present

## 2017-04-30 DIAGNOSIS — R296 Repeated falls: Secondary | ICD-10-CM

## 2017-04-30 DIAGNOSIS — R1319 Other dysphagia: Secondary | ICD-10-CM

## 2017-04-30 NOTE — Telephone Encounter (Signed)
Patient's wife made aware.

## 2017-04-30 NOTE — Telephone Encounter (Signed)
I don't manage or treat back pain and anything in the low back wouldn't explain his diffuse sx's.  Re: DM, if well controlled then sounds like his PCP is doing great job managing.

## 2017-04-30 NOTE — Patient Instructions (Signed)
1. Your provider has requested that you have labwork completed today. Please go to [facility] ([id number]) on the second floor of this building before leaving the office today. You do not need to check in. If you are not called within 15 minutes please check with the front desk.   2. We have scheduled you at [facility] for your modified barium swallow on 05/07/2017 at 1:00 pm. Please arrive 15 minutes prior and go to 1st floor radiology. If you need to reschedule for any reason please call [phone].

## 2017-04-30 NOTE — Telephone Encounter (Signed)
After appt patient's wife had multiple questions she wanted to ask Dr. Carles Collet. She is aware some are not neurologic but wants her opinion.   Does patient need MR lower back because of back pain? Back pain present 1-2 months. Comes and goes.   Should patient have an endocrinologist to manage diabetes? Well controlled. Managed by PCP.   He has decided he wants to proceed with EMG. Order entered.

## 2017-05-01 ENCOUNTER — Encounter: Payer: Self-pay | Admitting: Psychology

## 2017-05-01 ENCOUNTER — Other Ambulatory Visit (HOSPITAL_COMMUNITY): Payer: Self-pay | Admitting: Neurology

## 2017-05-01 ENCOUNTER — Encounter: Payer: Self-pay | Admitting: Neurology

## 2017-05-01 DIAGNOSIS — R1319 Other dysphagia: Secondary | ICD-10-CM

## 2017-05-05 LAB — MYASTHENIA GRAVIS PANEL 2
Acetylcholine Rec Binding: <0.3 nmol/L
Acetylcholine Rec Mod Ab: 4 % Inhibition
Aceytlcholine Rec Bloc Ab: <15 % Inhibition (ref <15)

## 2017-05-06 ENCOUNTER — Telehealth: Payer: Self-pay | Admitting: Neurology

## 2017-05-06 NOTE — Telephone Encounter (Signed)
-----   Message from [place], DO sent at 05/06/2017  8:56 AM EDT ----- Let pt/wife know that labs look good

## 2017-05-06 NOTE — Telephone Encounter (Signed)
Left message on machine for patient to let him know labs normal.

## 2017-05-07 ENCOUNTER — Ambulatory Visit (HOSPITAL_COMMUNITY)
Admission: RE | Admit: 2017-05-07 | Discharge: 2017-05-07 | Disposition: A | Discharge status: Home / Self Care | Payer: Medicare Other | Source: Ambulatory Visit | Attending: Neurology | Admitting: Neurology

## 2017-05-07 DIAGNOSIS — L409 Psoriasis, unspecified: Secondary | ICD-10-CM | POA: Diagnosis not present

## 2017-05-07 DIAGNOSIS — R1312 Dysphagia, oropharyngeal phase: Secondary | ICD-10-CM | POA: Diagnosis not present

## 2017-05-07 DIAGNOSIS — R51 Headache: Secondary | ICD-10-CM | POA: Insufficient documentation

## 2017-05-07 DIAGNOSIS — R1319 Other dysphagia: Secondary | ICD-10-CM

## 2017-05-07 DIAGNOSIS — K5790 Diverticulosis of intestine, part unspecified, without perforation or abscess without bleeding: Secondary | ICD-10-CM | POA: Insufficient documentation

## 2017-05-07 DIAGNOSIS — H409 Unspecified glaucoma: Secondary | ICD-10-CM | POA: Diagnosis not present

## 2017-05-07 DIAGNOSIS — Z9889 Other specified postprocedural states: Secondary | ICD-10-CM | POA: Insufficient documentation

## 2017-05-07 DIAGNOSIS — K219 Gastro-esophageal reflux disease without esophagitis: Secondary | ICD-10-CM | POA: Diagnosis not present

## 2017-05-07 DIAGNOSIS — C61 Malignant neoplasm of prostate: Secondary | ICD-10-CM | POA: Insufficient documentation

## 2017-05-07 DIAGNOSIS — E669 Obesity, unspecified: Secondary | ICD-10-CM | POA: Diagnosis not present

## 2017-05-07 DIAGNOSIS — I10 Essential (primary) hypertension: Secondary | ICD-10-CM | POA: Diagnosis not present

## 2017-05-07 DIAGNOSIS — E119 Type 2 diabetes mellitus without complications: Secondary | ICD-10-CM | POA: Insufficient documentation

## 2017-05-07 DIAGNOSIS — G4733 Obstructive sleep apnea (adult) (pediatric): Secondary | ICD-10-CM | POA: Diagnosis not present

## 2017-05-08 ENCOUNTER — Telehealth: Payer: Self-pay | Admitting: Neurology

## 2017-05-08 DIAGNOSIS — R1312 Dysphagia, oropharyngeal phase: Secondary | ICD-10-CM

## 2017-05-08 NOTE — Telephone Encounter (Signed)
Patient agreeable to ST. Order entered. They were given number to contact [facility] if they don't hear from them.

## 2017-05-08 NOTE — Telephone Encounter (Signed)
They would like a call back regarding a referral that was placed. She said she was returning your call.  Please call. Thanks

## 2017-05-08 NOTE — Telephone Encounter (Signed)
-----   Message from [place], DO sent at 05/08/2017  7:27 AM EDT ----- ST gave recommendations to patient but she also recommended ST consult as OP if he would like to proceed with that, please order.

## 2017-05-08 NOTE — Telephone Encounter (Signed)
Spoke with patient and his partner and made him aware of recommendations and asked if they would be agreeable to referral to speech therapy.   They state they want to hold on referral until patient has a diagnosis.  I did explain that MBE showed some mild oropharyngeal dysphagia, which may not be an overall diagnosis, but is a diagnosis and the treatment would be ST.  They will think about it and call back.

## 2017-05-19 ENCOUNTER — Encounter: Payer: Medicare Other | Admitting: Neurology

## 2017-05-25 ENCOUNTER — Ambulatory Visit (INDEPENDENT_AMBULATORY_CARE_PROVIDER_SITE_OTHER): Payer: Medicare Other | Admitting: Psychology

## 2017-05-25 ENCOUNTER — Encounter: Payer: Self-pay | Admitting: Psychology

## 2017-05-25 DIAGNOSIS — R413 Other amnesia: Secondary | ICD-10-CM | POA: Diagnosis not present

## 2017-05-25 NOTE — Progress Notes (Signed)
NEUROPSYCHOLOGICAL INTERVIEW (CPT: [id number])  Name: Corey Gill (goes by "Corey Gill") Date of Birth: [birth year] Date of Interview: 05/25/2017  Reason for Referral:  Corey Gill is a 66 y.o. male who is referred for neuropsychological evaluation by Corey Gill of [facility] due to concerns about memory loss, possible dementia. This patient is accompanied in the office by his partner, Corey Gill, who supplements the history.  History of Presenting Problem:  Corey Gill was previously seen for neurologic consultation by Corey Gill in 2014 for cramping and felt that this represented cramp fasciculation syndrome.  More recently he saw Corey Gill on 03/24/2017 for resting tremor and memory changes. MoCA was 22/30. He presented with gait instability, falls, left sided weakness with assymmetry of reflexes on neurologic examination. Corey Gill did not see evidence of Parkinson's diseae but did feel further neurologic workup was indicated. He underwent 1 hour EEG on 04/01/2017 which was normal. Labs were normal. MRI completed on 04/11/2017 reported revealed moderate findings of chronic microvascular ischemia and diffuse atrophy without acute intracranial abnormality.   He followed up with Corey Gill on 04/30/2017. His wife reported that speech, dizziness and left sided numbness from "head to feet" was worse.  He was falling much more frequently.  He reported slight head pain but not "head ache."  He reported some swallowing difficulty.  It was noted that he did have nonphysiologic aspects on examination (vibrational splitting on chest/head), and he demonstrated an unusual gait pattern with astasia abasia qualities (different from initial exam).  A swallow study was performed and showed mild oropharyngeal dysphagia.  At today's visit, the patient and his partner report significant memory difficulty in the past four months with mild gradual decline before that. He is much more forgetful. He has left the stove eye  on. He is misplacing items. He feels confused a lot of the time. He has some difficulty with temporal orientation (date, date, month). He can no longer multi-task. His wife notes that his handwriting started gradually changing a couple of years ago and is quite different now. She also reports that his spatial perception is off in the past 6 months; for example, he does not leave enough room for her when they are walking side by side.  The patient last worked in December 2017. 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 He was able to do his last job just fine, in October 2017, but now there is no way he could do it, per his wife. 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 His friends are helping him. He recently placed first in a competition.  He is still driving. He says he has difficulty "every once in a while" remembering how to get somewhere, but it is episodic. Corey Gill says he is fine with "the normal routes" but if it not a route he takes regularly then he has questions. He manages his medications and states that he occasionally forgets to take them. Corey Gill helps with managing his appointments and the calendar. She oversees it and helps cue him. It is new for him to have to use a calendar. She states she is helping more with management of finances/bills.  Upon direct questioning, the patient and his partner reported the following with regard to current cognitive functioning:   Forgetting recent conversations/events: Yes Repeating statements/questions: Yes  Misplacing/losing items: Yes, he has put the jam in the medicine cabinet for example Difficulty concentrating: Yes, "I wander off. I know  what I'm thinking and then I lose it all of a sudden." Word-finding difficulty: Yes, "I know what I want to say, but it's like 2-3 things that I could say come out at once." He has told Corey Gill that there are a number of  thoughts/ideas/sentences in his head and he can see them but can't get them organized and get them out. Corey Gill says his communication seems to be getting worse each day. There was no stuttering 6 mos ago but it is prominent now. Writing difficulty: Yes Spelling difficulty: Yes Comprehension difficulty: Sometimes doesn't understand what someone is asking him, will have to ask what they mean. Corey Gill says he has more trouble processing information during conversation despite clearly listening.  He has family history of PD in his father (still alive, age 1). He has family history of dementia in his maternal uncle. His mother also had a lot of forgetfulness but was never diagnosed with dementia.  With regard to physical functioning, Corey Gill states that his balance and mobility is much worse, he is falling regularly (he fell today), he has episodes of dizziness and blurred vision which are happening more often. He has not had any loss of consciousness. He reports unusual visual symptoms which he describes as seeing "kaleidescopes" sometimes - "a piece of jagged glass, zig zag in corner of my eye, comes over the eye becomes larger and larger, rainbow of colors, stays about 5 minutes, can't see anything else". This has been going on for a couple years. He does see eye doctor regularly, no etiology has been identified. It can happen when he is watching TV or driving.   The patient has a history of sleep apnea and uses his CPAP but it has been bothering him more recently. He reports that leg cramps are interfering more with sleep as well. He endorses increased fatigue during the day. There is no change in appetite.  Corey Gill reported a prior history of head injury in 1986 when playing baseball. A player dove into him, and he fell backwards and hit his head on the ground, +LOC (minutes). He did not seek medical attention. He had a headache for a couple days, and then it went away. He back to normal life. A month  later, he woke up from sleeping on the recliner and had no sensation or ability to move below his neck. He had spinal surgery. He reports he still has neck problems and gets pounding headaches that come and go.  The patient denied any prior psychiatric history. With his recent onset of neurologic symptoms, he has been much more stressed. Corey Gill describes his current mood as "unpredictable". He has more of a tendency to get frustrated and irritable. He is also tearful more often. He feels anxious and nervous "all the time". He used to be very laid back but recently he is much more labile and anxious, per Corey Gill.  The patient admits he has had suicidal ideation. He has thought that if he were to take his life, he would "go fishing and not come back". However, his partner is a protective factor. He is able to share these feelings with her. He denies suicidal intention.    Social History: Born/Raised: [place] He has spent most of his life in [place]. Education: 2 years of community college Occupational history: Currently unemployed. 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 He has been with his partner for 24 years. He has no children. Alcohol: None, never  Tobacco: Former, quit 24 years ago SA: None, never   Medical History: Past Medical History:  Diagnosis Date  . Benign fasciculation-cramp syndrome ([place]) 03/28/2013  . Diabetes mellitus without complication ([place])   . Diverticulosis   . Essential hypertension   . GERD (gastroesophageal reflux disease)   . Glaucoma    Bilateral  . Headache   . History of colonoscopy   . OSA on CPAP   . Psoriasis      Current Medications:  Outpatient Encounter Prescriptions as of 05/25/2017  Medication Sig  . acetaminophen (TYLENOL) 500 MG tablet Take 500 mg by mouth every 8 (eight) hours as needed (migraines). Pt states he takes this just a few times a year  . amLODipine (NORVASC) 2.5 MG tablet  Take 2.5 mg by mouth daily.  Marland Kitchen augmented betamethasone dipropionate (DIPROLENE-AF) 0.05 % cream Apply 1 application topically daily.   . carboxymethylcellulose (REFRESH PLUS) 0.5 % SOLN Place 1 drop into both eyes daily as needed (dry eyes).  . Cholecalciferol (VITAMIN D) 2000 UNITS CAPS Take 2,000 Units by mouth daily.  Marland Kitchen lisinopril (PRINIVIL,ZESTRIL) 40 MG tablet Take 40 mg by mouth daily.  . metFORMIN (GLUCOPHAGE) 500 MG tablet Take 500 mg by mouth daily with breakfast.  . minocycline (DYNACIN) 50 MG tablet Take 50 mg by mouth daily as needed (infection).  . mometasone (ELOCON) 0.1 % ointment Apply 1 application topically daily.  Marland Kitchen omeprazole (PRILOSEC) 20 MG capsule Take 20 mg by mouth daily.  . pravastatin (PRAVACHOL) 10 MG tablet Take 10 mg by mouth daily.  . Pseudoephedrine HCl (SUDAFED 24 HOUR NON-DROWSY) 240 MG TB24 Take 1 tablet by mouth as needed. Headaches, takes with the tylenol  . timolol (BETIMOL) 0.5 % ophthalmic solution Place 2 drops into both eyes daily.  Marland Kitchen tobramycin-dexamethasone (TOBRADEX) ophthalmic ointment Place 1 application into both eyes daily as needed (irritation).   No facility-administered encounter medications on file as of 05/25/2017.      Behavioral Observations:   Appearance: Neatly, casually and appropriately dressed and groomed Gait: Ambulated with a cane Speech: Dysfluent, stuttering, word finding difficulty, soft volume Thought process: Generally linear. At one point during the interview, I asked him a question and he sustained full eye contact and appeared to be listening but never responded. His wife encouraged him to answer, and he said he didn't know I had asked him a question. This happened on only one occasion, when I was asking about suicidal ideation. Affect: Blunted, dysthymic Interpersonal: Pleasant, appropriate   TESTING: There is medical necessity to proceed with neuropsychological assessment as the results will be used to aid in  differential diagnosis and clinical decision-making and to inform specific treatment recommendations. Per the patient, his partner and medical records reviewed, there has been a change in cognitive functioning and a reasonable suspicion of dementia. There is also a need for objective testing of the patient's subjective cognitive complaints in order to determine neurologic versus psychiatric etiology of symptoms.  PLAN: The patient will return for a full battery of neuropsychological testing on 06/03/2017 with my psychometrician under my supervision. Education regarding testing procedures was provided. Subsequently, the patient will see me for a follow-up session on 06/15/2017 at which time his test performances and my impressions and treatment recommendations will be reviewed in detail.  Full neuropsychological evaluation report to follow.

## 2017-05-26 ENCOUNTER — Ambulatory Visit (INDEPENDENT_AMBULATORY_CARE_PROVIDER_SITE_OTHER): Payer: Medicare Other | Admitting: Neurology

## 2017-05-26 DIAGNOSIS — E119 Type 2 diabetes mellitus without complications: Secondary | ICD-10-CM | POA: Diagnosis not present

## 2017-05-26 DIAGNOSIS — R2681 Unsteadiness on feet: Secondary | ICD-10-CM | POA: Diagnosis not present

## 2017-05-26 DIAGNOSIS — R296 Repeated falls: Secondary | ICD-10-CM

## 2017-05-26 NOTE — Procedures (Signed)
[facility]  [address], [address]  [place], [place] [address] Tel: [phone] Fax:  [phone] Test Date:  05/26/2017  Patient: Corey Gill DOB: [birth year] Physician: Narda Amber, DO  Sex: Male Height: 5\' 9"  Ref Phys: Alonza Bogus, DO  ID#: [id number] Temp: 34.6C Technician:    Patient Complaints: This is a 66 year old man with history of cramp fasciculation syndrome referred for evaluation of gait instability, falls, and dysarthria.  NCV & EMG Findings: Extensive electrodiagnostic testing of the left upper and lower extremities as well as the bulbar muscles shows:  1. All sensory responses including the left median, ulnar, sural, and superficial peroneal nerves are within normal limits. 2. All motor responses including the left median, ulnar, peroneal, and tibial nerves are within normal limits. 3. There is no evidence of active or chronic motor axon loss changes affecting any of the tested muscles of the left upper extremity, left lower extremity, or bulbar muscles. 4. Fasciculation potentials were seen involving the first dorsal interosseous, tibialis anterior, and medial gastrocnemius muscles.   Impression: This is a normal study. In particular, there is no evidence of a sensorimotor polyneuropathy, cervical/lumbosacral radiculopathy, or widespread disorder of anterior horn cells.   ___________________________ Narda Amber, DO    Nerve Conduction Studies Anti Sensory Summary Table   Site NR Peak (ms) Norm Peak (ms) P-T Amp (V) Norm P-T Amp  Left Median Anti Sensory (2nd Digit)  34.6C  Wrist    3.6 <3.8 13.3 >10  Left Sup Peroneal Anti Sensory (Ant Lat Mall)  34.6C  12 cm    2.9 <4.6 4.1 >3  Left Sural Anti Sensory (Lat Mall)  34.6C  Calf    3.8 <4.6 7.6 >3  Left Ulnar Anti Sensory (5th Digit)  34.6C  Wrist    3.0 <3.2 10.5 >5   Motor Summary Table   Site NR Onset (ms) Norm Onset (ms) O-P Amp (mV) Norm O-P Amp Site1 Site2 Delta-0 (ms) Dist  (cm) Vel (m/s) Norm Vel (m/s)  Left Median Motor (Abd Poll Brev)  34.6C  Wrist    3.3 <4.0 7.8 >5 Elbow Wrist 5.7 30.0 53 >50  Elbow    9.0  7.3         Left Peroneal Motor (Ext Dig Brev)  34.6C  Ankle    4.7 <6.0 5.1 >2.5 B Fib Ankle 7.3 30.0 41 >40  B Fib    12.0  4.9  Poplt B Fib 1.8 9.0 50 >40  Poplt    13.8  4.6         Left Peroneal TA Motor (Tib Ant)  34.6C  Fib Head    2.6 <4.5 7.3 >3 Poplit Fib Head 1.2 8.0 67 >40  Poplit    3.8  7.3         Left Tibial Motor (Abd Hall Brev)  34.6C  Ankle    5.2 <6.0 10.0 >4 Knee Ankle 8.9 36.0 40 >40  Knee    14.1  6.7         Left Ulnar Motor (Abd Dig Minimi)  34.6C  Wrist    2.7 <3.1 9.3 >7 B Elbow Wrist 4.0 25.0 63 >50  B Elbow    6.7  8.4  A Elbow B Elbow 1.8 10.0 56 >50  A Elbow    8.5  8.3          H Reflex Studies   NR H-Lat (ms) Lat Norm (ms) L-R H-Lat (ms)  Left Tibial (Gastroc)  34.6C     35.24 <35    EMG   Side Muscle Ins Act Fibs Psw Fasc Number Recrt Dur Dur. Amp Amp. Poly Poly. Comment  Left AntTibialis Nml Nml Nml 1+ Nml Nml Nml Nml Nml Nml Nml Nml N/A  Left Gastroc Nml Nml Nml 1+ Nml Nml Nml Nml Nml Nml Nml Nml N/A  Left Flex Dig Long Nml Nml Nml Nml Nml Nml Nml Nml Nml Nml Nml Nml N/A  Left RectFemoris Nml Nml Nml Nml Nml Nml Nml Nml Nml Nml Nml Nml N/A  Left GluteusMed Nml Nml Nml Nml Nml Nml Nml Nml Nml Nml Nml Nml N/A  Left 1stDorInt Nml Nml Nml 1+ Nml Nml Nml Nml Nml Nml Nml Nml N/A  Left PronatorTeres Nml Nml Nml Nml Nml Nml Nml Nml Nml Nml Nml Nml N/A  Left Biceps Nml Nml Nml Nml Nml Nml Nml Nml Nml Nml Nml Nml N/A  Left Triceps Nml Nml Nml Nml Nml Nml Nml Nml Nml Nml Nml Nml N/A  Left Deltoid Nml Nml Nml Nml Nml Nml Nml Nml Nml Nml Nml Nml N/A  Left Mentalis Nml Nml Nml Nml Nml Nml Nml Nml Nml Nml Nml Nml N/A  Left Hyoglossus Nml Nml Nml Nml Nml Nml Nml Nml Nml Nml Nml Nml N/A      Waveforms:

## 2017-05-27 ENCOUNTER — Telehealth: Payer: Self-pay | Admitting: *Deleted

## 2017-05-27 ENCOUNTER — Encounter: Payer: Self-pay | Admitting: *Deleted

## 2017-05-27 NOTE — Telephone Encounter (Signed)
-----   Message from Gregory, DO sent at 05/27/2017  9:09 AM EDT ----- You can let pt know that EMG is normal.  I think that patient has a f/u consult with Dr. Posey Pronto

## 2017-05-27 NOTE — Telephone Encounter (Signed)
Results sent via My Chart.  

## 2017-06-01 DIAGNOSIS — I10 Essential (primary) hypertension: Secondary | ICD-10-CM | POA: Diagnosis not present

## 2017-06-01 DIAGNOSIS — G4730 Sleep apnea, unspecified: Secondary | ICD-10-CM | POA: Diagnosis not present

## 2017-06-01 DIAGNOSIS — Z8042 Family history of malignant neoplasm of prostate: Secondary | ICD-10-CM | POA: Diagnosis not present

## 2017-06-01 DIAGNOSIS — E782 Mixed hyperlipidemia: Secondary | ICD-10-CM | POA: Diagnosis not present
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
## 2017-06-02 ENCOUNTER — Encounter: Payer: Self-pay | Admitting: Cardiology

## 2017-06-02 ENCOUNTER — Ambulatory Visit (INDEPENDENT_AMBULATORY_CARE_PROVIDER_SITE_OTHER): Payer: Medicare Other | Admitting: Cardiology

## 2017-06-02 VITALS — BP 130/68 | HR 66 | Ht 69.5 in | Wt 207.4 lb

## 2017-06-02 DIAGNOSIS — G4733 Obstructive sleep apnea (adult) (pediatric): Secondary | ICD-10-CM

## 2017-06-02 DIAGNOSIS — E669 Obesity, unspecified: Secondary | ICD-10-CM

## 2017-06-02 DIAGNOSIS — Z9989 Dependence on other enabling machines and devices: Secondary | ICD-10-CM | POA: Diagnosis not present

## 2017-06-02 DIAGNOSIS — I10 Essential (primary) hypertension: Secondary | ICD-10-CM | POA: Diagnosis not present

## 2017-06-02 NOTE — Patient Instructions (Signed)
Your physician wants you to follow-up in: 1 year. You will receive a reminder letter in the mail two months in advance. If you don't receive a letter, please call our office to schedule the follow-up appointment.  

## 2017-06-03 ENCOUNTER — Ambulatory Visit (INDEPENDENT_AMBULATORY_CARE_PROVIDER_SITE_OTHER): Payer: Medicare Other | Admitting: Psychology

## 2017-06-03 DIAGNOSIS — R413 Other amnesia: Secondary | ICD-10-CM | POA: Diagnosis not present

## 2017-06-03 NOTE — Progress Notes (Signed)
   Neuropsychology Note  Corey Gill returned today for 2 hours of neuropsychological testing with technician, Milana Kidney, BS, under the supervision of Dr. Macarthur Critchley. The patient did not appear overtly distressed by the testing session, per behavioral observation or via self-report to the technician. Rest breaks were offered. Corey Gill will return within 2 weeks for a feedback session with Dr. Si Raider at which time his test performances, clinical impressions and treatment recommendations will be reviewed in detail. The patient understands he can contact our office should he require our assistance before this time.  Full report to follow.

## 2017-06-12 NOTE — Progress Notes (Signed)
NEUROPSYCHOLOGICAL EVALUATION   Name:    Corey Gill  Date of Birth:   [birth year] Date of Interview:  05/25/2017 Date of Testing:  06/03/2017   Date of Feedback:  06/15/2017       Background Information:  Reason for Referral:  Corey Gill (goes by "Corey Gill") is a 66 y.o. male referred by Dr. Wells Guiles Tat of [facility] to assess his current level of cognitive functioning and assist in differential diagnosis. Corey current evaluation consisted of a review of available medical records, an interview with Corey Gill and his partner, Corey Gill, and Corey completion of a neuropsychological testing battery. Informed consent was obtained.  History of Presenting Problem:  Corey Gill was previously seen for neurologic consultation by Dr. Jannifer Franklin in 2014 for cramping and felt that this represented cramp fasciculation syndrome.  More recently he saw Dr. Carles Collet on 03/24/2017 for resting tremor and memory changes. MoCA was 22/30. He presented with gait instability, falls, left sided weakness with assymmetry of reflexes on neurologic examination. Dr. Carles Collet did not see evidence of Parkinson's diseae but did feel further neurologic workup was indicated. He underwent 1 hour EEG on 04/01/2017 which was normal. Labs were normal. MRI completed on 04/11/2017 reported revealed moderate findings of chronic microvascular ischemia and diffuse atrophy without acute intracranial abnormality.   He followed up with Dr. Carles Collet on 04/30/2017. His wife reported that speech, dizziness and left sided numbness from "head to feet" was worse. He was falling much more frequently. He reported slight head pain but not "head ache." He reported some swallowing difficulty. It was noted that he did have nonphysiologic aspects on examination (vibrational splitting on chest/head), and he demonstrated an unusual gait pattern with astasia abasia qualities (different from initial exam).  A swallow study was performed and showed mild  oropharyngeal dysphagia. An EMG was performed and was normal.  At today's visit, Corey Gill and his partner report significant memory difficulty in Corey past four months with mild gradual decline before that. He is much more forgetful. He has left Corey stove eye on. He is misplacing items. He feels confused a lot of Corey time. He has some difficulty with temporal orientation (date, date, month). He can no longer multi-task. His wife notes that his handwriting started gradually changing a couple of years ago and is quite different now. She also reports that his spatial perception is off in Corey past 6 months; for example, he does not leave enough room for her when they are walking side by side.  Corey Gill last worked in December 2017. 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 He was able to do his last job just fine, in October 2017, but now there is no way he could do it, per his wife. 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 His friends are helping him. He recently placed first in a competition.  He is still driving. He says he has difficulty "every once in a while" remembering how to get somewhere, but it is episodic. Corey Gill says he is fine with "Corey normal routes" but if it not a route he takes regularly then he has questions. He manages his medications and states that he occasionally forgets to take them. Corey Gill helps with managing his appointments and Corey calendar. She oversees it and helps cue him. It is new for him to have to use a calendar. She states she is helping more with management of finances/bills.  Upon direct  questioning, Corey Gill and his partner reported Corey following with regard to current cognitive functioning:   Forgetting recent conversations/events: Yes Repeating statements/questions: Yes  Misplacing/losing items: Yes, he has put Corey jam in Corey medicine cabinet for example Difficulty concentrating:  Yes, "I wander off. I know what I'm thinking and then I lose it all of a sudden." Word-finding difficulty: Yes, "I know what I want to say, but it's like 2-3 things that I could say come out at once." He has told Corey Gill that there are a number of thoughts/ideas/sentences in his head and he can see them but can't get them organized and get them out. Corey Gill says his communication seems to be getting worse each day. There was no stuttering 6 mos ago but it is prominent now. Writing difficulty: Yes Spelling difficulty: Yes Comprehension difficulty: Sometimes doesn't understand what someone is asking him, will have to ask what they mean. Corey Gill says he has more trouble processing information during conversation despite clearly listening.  He has family history of PD in his father (still alive, age 70). He has family history of dementia in his maternal uncle. His mother also had a lot of forgetfulness but was never diagnosed with dementia.  With regard to physical functioning, Corey Gill states that his balance and mobility is much worse, he is falling regularly (he fell today), he has episodes of dizziness and blurred vision which are happening more often. He has not had any loss of consciousness. He reports unusual visual symptoms which he describes as seeing "kaleidescopes" sometimes - "a piece of jagged glass, zig zag in corner of my eye, comes over Corey eye becomes larger and larger, rainbow of colors, stays about 5 minutes, can't see anything else". This has been going on for a couple years. He does see eye doctor regularly, no etiology has been identified. It can happen when he is watching TV or driving.   Corey Gill has a history of sleep apnea and uses his CPAP but it has been bothering him more recently. He reports that leg cramps are interfering more with sleep as well. He endorses increased fatigue during Corey day. There is no change in appetite.  Corey Gill reported a prior history of head injury  in 1986 when playing baseball. A player dove into him, and he fell backwards and hit his head on Corey ground, +LOC (minutes). He did not seek medical attention. He had a headache for a couple days, and then it went away. He back to normal life. A month later, he woke up from sleeping on Corey recliner and had no sensation or ability to move below his neck. He had spinal surgery. He reports he still has neck problems and gets pounding headaches that come and go.  Corey Gill denied any prior psychiatric history. With his recent onset of neurologic symptoms, he has been much more stressed. Corey Gill describes his current mood as "unpredictable". He has more of a tendency to get frustrated and irritable. He is also tearful more often. He feels anxious and nervous "all Corey time". He used to be very laid back but recently he is much more labile and anxious, per Corey Gill.  Corey Gill admits he has had suicidal ideation. He has thought that if he were to take his life, he would "go fishing and not come back". However, his partner is a protective factor. He is able to share these feelings with her. He denies suicidal intention.   Social History: Born/Raised: [place] He has spent  most of his life in [place]. Education: 2 years of community college Occupational history: Currently unemployed. 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 He has been with his partner for 24 years. He has no children. Alcohol: None, never Tobacco: Former, quit 24 years ago SA: None, never   Medical History:  Past Medical History:  Diagnosis Date  . Benign fasciculation-cramp syndrome ([place]) 03/28/2013  . Diabetes mellitus without complication ([place])   . Diverticulosis   . Essential hypertension   . GERD (gastroesophageal reflux disease)   . Glaucoma    Bilateral  . Headache   . History of colonoscopy   . OSA on CPAP   . Psoriasis     Current medications:  Outpatient  Encounter Prescriptions as of 06/15/2017  Medication Sig  . acetaminophen (TYLENOL) 500 MG tablet Take 500 mg by mouth every 8 (eight) hours as needed (migraines). Pt states he takes this just a few times a year  . amLODipine (NORVASC) 2.5 MG tablet Take 2.5 mg by mouth daily.  . carboxymethylcellulose (REFRESH PLUS) 0.5 % SOLN Place 1 drop into both eyes daily as needed (dry eyes).  . Cholecalciferol (VITAMIN D) 2000 UNITS CAPS Take 2,000 Units by mouth daily.  . dorzolamide (TRUSOPT) 2 % ophthalmic solution 1 drop 2 (two) times daily.   . halobetasol (ULTRAVATE) 0.05 % ointment Apply topically daily.  Marland Kitchen lisinopril (PRINIVIL,ZESTRIL) 40 MG tablet Take 40 mg by mouth daily.  . metFORMIN (GLUCOPHAGE) 500 MG tablet Take 500 mg by mouth daily with breakfast.  . mometasone (ELOCON) 0.1 % ointment Apply 1 application topically daily.  Marland Kitchen omeprazole (PRILOSEC) 20 MG capsule Take 20 mg by mouth daily.  . pravastatin (PRAVACHOL) 10 MG tablet Take 10 mg by mouth daily.  . Pseudoephedrine HCl (SUDAFED 24 HOUR NON-DROWSY) 240 MG TB24 Take 1 tablet by mouth as needed. Headaches, takes with Corey tylenol  . timolol (BETIMOL) 0.5 % ophthalmic solution Place 2 drops into both eyes daily.   No facility-administered encounter medications on file as of 06/15/2017.     Current Examination:  Behavioral Observations:  Appearance: Neatly, casually and appropriately dressed and groomed Gait: Ambulated with a cane Speech: Dysfluent, stuttering, word finding difficulty, soft volume Thought process: Generally linear. At one point during Corey interview, I asked him a question and he sustained full eye contact and appeared to be listening but never responded. His wife encouraged him to answer, and he said he didn't know I had asked him a question. This happened on only one occasion, when I was asking about suicidal ideation. Affect: Blunted, dysthymic Interpersonal: Pleasant, appropriate Orientation: Oriented to person,  place, moth and year. Did not know Corey current date and was one day off on Corey day of Corey week. Accurately named Corey current President but could not recall Corey name of his predecessor.  Tests Administered: . Test of Premorbid Functioning (TOPF) . Wechsler Adult Intelligence Scale-Fourth Edition (WAIS-IV): Similarities, Music therapist, Coding and Digit Span subtests . [employer] Verbal Learning Test - 2nd Edition (CVLT-2) Short Form . Repeatable Battery for Corey Assessment of Neuropsychological Status (RBANS) Form A:  Story Memory and Story recall subtests, Figure Copy and Recall subtests and Semantic Fluency subtest . Neuropsychological Assessment Battery (NAB) Language Module, Form 1: Naming subtest . Boston Diagnostic Aphasia Examination: Complex Ideational Material subtest . Controlled Oral Word Association Test (COWAT) . Trail Making Test A and B . Clock drawing test . Beck Depression Inventory - 2nd  Edition (BDI-II) . Generalized Anxiety Disorder - 7 item screener (GAD-7) . Green's WMT  Test Results: Standardized scores are presented only for use by appropriately trained professionals and to allow for any future test-retest comparison. These scores should not be interpreted without consideration of all Corey information that is contained in Corey rest of Corey report. Corey most recent standardization samples from Corey test publisher or other sources were used whenever possible to derive standard scores; scores were corrected for age, gender, ethnicity and education when available.  NOTE: Corey following test performances may not provide a completely valid estimate of Corey Gill current neuropsychological functioning as there was evidence of suboptimal effort to engage in Corey cognitive tests. True abilities are thought to be of at least Corey level reported here and deficits cannot be assumed to be genuine.   Test Scores:  Test Name Raw Score Standardized Score Descriptor  TOPF 28/70 SS= 88 Low  average  WAIS-IV Subtests     Similarities 17/36 ss= 7 Low average  Block Design 32/66 ss= 10 Average  Coding 34/135 ss= 6 Low average  Digit Span  15/48 ss= 4 Impaired  RBANS Subtests     Story Memory 14/24 Z= -1.3 Low average  Story Recall 9/12 Z= -0.1 Average  Figure Copy 19/20 Z= 0.5 Average  Figure Recall 14/20 Z= 0.1 Average  Semantic Fluency 15/40 Z= -1.3 Low average  CVLT-II Scores     Trial 1 3/9 Z= -3 Severely impaired  Trial 4 9/9 Z= 1.5 Superior  Trials 1-4 total 24/36 T= 46 Average  SD Free Recall 5/9 Z= -1 Low average  LD Free Recall 4/9 Z= -1 Low average  LD Cued Recall 5/9 Z= -0.5 Average  Recognition Discriminability 7/9 hits, 0 false positives Z= 0 Average  Forced Choice Recognition 9/9  WNL  NAB Language subtest     Naming 29/31 T= 44 Average  BDAE Subtest     Complex Ideational Material 10/12  Abnormal  COWAT-FAS 37 T= 46 Average  COWAT-Animals 17 T= 47 Average  Trail Making Test A  55" 0 errors T= 34 Borderline  Trail Making Test B  148" 0 errors T= 33 Borderline  Clock Drawing   WNL  BDI-II 16/63  Mild  GAD-7 21/21  Severe      Description of Test Results:  Corey Gill demonstrated suboptimal levels of effort and attention on a stand alone test of performance validity and memory malingering. As such, Corey Gill's current performance on neurocognitive testing may not accurately reflect his true cognitive abilities, and results of neuropsychological testing cannot be validly interpreted. Nonetheless, it should be noted that Corey Gill performed within normal limits on tests of visual-spatial construction, verbal fluency, confrontation naming, learning and memory of simple verbal information, memory of visual information, and some aspects of executive function (i.e., clock drawing, verbal abstract reasoning) which at least argues against deficits in these domains of cognitive function.  On a self-report measure of mood, Corey Gill's responses were  indicative of clinically significant depression at Corey present time. He endorsed passive suicidal ideation but denied intention or plan. On a self-report measure of anxiety, Corey Gill endorsed clinically significant, severe generalized anxiety at Corey present time, characterized by daily nervousness, worrying, inability to control worrying, inability to relax, restlessness, irritability and fear of Corey worst happening.   Clinical Impressions: Anxiety disorder, unspecified. Major depressive episode. Corey Gill's neurocognitive test results were indicative of suboptimal effort/attention. Results also did not align with reported deficits in  daily life, and there was no clear pattern of deficits to indicate any specific neurocognitive disorder or dementia. He did endorse severe anxiety as well as depression with suicidal ideation. Mental health treatment is advised. It is hoped that treatment of depression and anxiety may in turn improve his cognitive functioning in daily life.    Recommendations/Plan: 1. A combination of psychopharmacological treatment and mental health counseling with a psychologist is recommended. He will speak with his partner about this. He knows that we can put in a referral to a psychiatrist and/or behavioral health if he decides he would like to go forward with either or both treatment(s).  2. He will follow up with Dr. Carles Collet about his neurologic symptoms and diagnostic impressions based on all Corey testing he has completed to date.   Feedback to Gill: Corey Gill and his partner's daughter returned for a feedback appointment on 06/15/2017 to review Corey results of his neuropsychological evaluation with this provider. 25 minutes face-to-face time was spent reviewing his test results, my impressions and my recommendations as detailed above.    Total time spent on this Gill's case: [id number] unit for interview with psychologist; [phone] units of testing by psychometrician  under psychologist's supervision; [phone] units for medical record review, scoring of neuropsychological tests, interpretation of test results, preparation of this report, and review of results to Corey Gill by psychologist.      Thank you for your referral of Corey Gill. Please feel free to contact me if you have any questions or concerns regarding this report.

## 2017-06-15 ENCOUNTER — Encounter: Payer: Self-pay | Admitting: Psychology

## 2017-06-15 ENCOUNTER — Telehealth: Payer: Self-pay | Admitting: Neurology

## 2017-06-15 ENCOUNTER — Ambulatory Visit (INDEPENDENT_AMBULATORY_CARE_PROVIDER_SITE_OTHER): Payer: Medicare Other | Admitting: Psychology

## 2017-06-15 DIAGNOSIS — R413 Other amnesia: Secondary | ICD-10-CM

## 2017-06-15 NOTE — Patient Instructions (Signed)
   The effect of depression and anxiety on cognitive functioning: . One of the typical symptoms of depression is difficulty concentrating and making decisions, and various types of anxiety also interfere with attention and concentration . Problems with attention and concentration can disrupt the process of learning and making new memories, which can make it seem like there is a problem with your memory. In your daily life, you may experience this disruption as forgetting names and appointments, misplacing items, and needing to make lists for shopping and errands. It may be harder for you to stay focused on tasks and feel as "sharp" as you did in the past.  . Also, when we are depressed or anxious, we often pay more attention to our difficulties (rather than our strengths) in our daily life, and this can make it seem to [place] like we are doing worse cognitively than we really are. . The cognitive aspects of depression and anxiety are sometimes observed as an identifiable pattern of poor performance on a neuropsychological evaluation, but it is also possible that all scores on an evaluation are within normal limits. . Regardless of the test scores, distress related to depression and anxiety can interfere with the ability to make use of your cognitive resources and function optimally across settings such as work or school, maintaining the home and responsibilities, and personal relationships. . Fortunately, there are treatments for depression and anxiety, and when mood improves, cognitive functioning in daily life often improves. . Treatment options include psychotherapy, medications (e.g., antidepressants), and behavioral changes, such as increasing your involvement in enjoyable activities, increasing the amount of exercise you are getting, and maintaining a regular routine.

## 2017-06-15 NOTE — Progress Notes (Signed)
Corey Gill, I see that patient expressed SI with Dr. Si Raider.  Please find out if still has SI with plan.  If so, call 911.  If not, refer to psych if patient agreeable.

## 2017-06-15 NOTE — Telephone Encounter (Signed)
Ludwig Clarks, DO at 06/15/2017 3:00 PM   Status: Corey Gill, I see that patient expressed SI with Dr. Si Raider.  Please find out if still has SI with plan.  If so, call 911.  If not, refer to psych if patient agreeable.       Left message on machine for patient to call back.

## 2017-06-15 NOTE — Telephone Encounter (Signed)
-----   Message from [place], DO sent at 06/15/2017  3:39 PM EDT -----   ----- Message ----- From: Kandis Nab, PsyD Sent: 06/15/2017   3:27 PM To: Eustace Quail Tat, DO

## 2017-06-16 NOTE — Telephone Encounter (Signed)
Spoke with patient.   He states no additional suicidal thoughts. Does not have a plan. He wants to see psychiatry, but wants to wait on his partner to set this up for him (she is currently out of town). He will discuss with her when she gets back and have her call me to set up referral and follow up appt.   He will call with any changes.

## 2017-06-22 ENCOUNTER — Encounter: Payer: Medicare Other | Admitting: Psychology

## 2017-07-21 ENCOUNTER — Encounter: Payer: Medicare Other | Admitting: Psychology

## 2017-07-21 ENCOUNTER — Ambulatory Visit: Payer: Medicare Other | Attending: Neurology | Admitting: *Deleted

## 2017-07-21 DIAGNOSIS — R1311 Dysphagia, oral phase: Secondary | ICD-10-CM | POA: Diagnosis present

## 2017-07-21 DIAGNOSIS — R471 Dysarthria and anarthria: Secondary | ICD-10-CM
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
## 2017-07-22 ENCOUNTER — Telehealth: Payer: Self-pay | Admitting: Neurology

## 2017-07-22 NOTE — Telephone Encounter (Signed)
-----   Message from [place], DO sent at 07/21/2017  3:05 PM EST ----- Regarding: RE: recommend PT/OT evaluation His EMG has been normal so motor neuron disorder and CIDP/AIDP has been ruled out.  He wanted a consult with Dr. Posey Pronto but doesn't look like he has made that.  He doesn't have a movement disorder so I unfortunately have nothing further to offer this gentleman.  I copied my MA to see if he still wanted an appointment with Dr. Posey Pronto.  Jade, see me about this please.  Thanks! ----- Message ----- From: Rodney Booze Sent: 07/21/2017   1:23 PM To: Eustace Quail Tat, DO Subject: recommend PT/OT evaluation                     Thank you for this speech therapy referral. He is a very interesting person! Quite concerned for the apparent sudden onset of symptoms and rapid deterioration. Increasing weakness, decreasing endurance, strength and sensation on the left but marked lingual deviation to the right. What is going on with this gentleman?  I recommend PT and OT evaluations, considering his continued deterioration.   Any information you could provide regarding etiology would be helpful to determine appropriate course of treatment. He mentioned Myasthenia Gravis had been ruled out. How about other disorders? ALS, Rosalee Kaufman, AIDP/CIDP? I look forward to working together with you on this patient.  Thank you again, Celia B. Quentin Ore, [facility], [place] Pathologist [phone]

## 2017-07-22 NOTE — Telephone Encounter (Signed)
Spoke with patient and he states he has to discuss with his partner. He is undecided about seeing Dr. Posey Pronto for evaluation. They will discuss and call our office back.

## 2017-08-12 ENCOUNTER — Ambulatory Visit: Payer: Medicare Other

## 2017-08-12 DIAGNOSIS — R1311 Dysphagia, oral phase: Secondary | ICD-10-CM

## 2017-08-12 DIAGNOSIS — R471 Dysarthria and anarthria: Secondary | ICD-10-CM
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
## 2017-08-12 NOTE — Patient Instructions (Signed)
Speech Exercises  Repeat each 2 times, 2-3 times a day  Call the cat "Buttercup" A calendar of [place], [place] Four floors to cover Yellow oil ointment Fellow lovers of felines Catastrophe in Earth' plums The church's chimes chimed Telling time 'til eleven Five valve levers Keep the gate closed Go see that guy Fat cows give milk [employer] Gophers Fat frogs flip freely Kohl's into bed Get that game to [employer] Thick thistles stick together Cinnamon aluminum linoleum Black bugs blood Lovely lemon linament Red leather, yellow leather  Big grocery buggy    Purple baby carriage [facility] Proper copper coffee pot Ripe purple cabbage Three free throws [employer] tackled  [employer] dipped the dessert  [employer] that [employer] of [employer] Shirts shrink, shells shouldn't [place] [employer]ers Take the tackle box File the flash message Give me five flapjacks Fundamental relatives Dye the pets purple Talking [place] time after time Dark chocolate chunks Political landscape of the kingdom Estate manager/land agent genius We played yo-yos yesterday

## 2017-08-19 ENCOUNTER — Ambulatory Visit: Payer: Medicare Other | Attending: Neurology

## 2017-08-19 ENCOUNTER — Other Ambulatory Visit: Payer: Self-pay

## 2017-08-19 VITALS — HR 73

## 2017-08-19 DIAGNOSIS — R471 Dysarthria and anarthria: Secondary | ICD-10-CM | POA: Insufficient documentation

## 2017-08-19 DIAGNOSIS — R1311 Dysphagia, oral phase: Secondary | ICD-10-CM | POA: Diagnosis present

## 2017-08-19 NOTE — Patient Instructions (Signed)
   Keep a "cough log" to track : When you coughed (date, time) On what type of food or liquid you coughed
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
## 2017-08-25 ENCOUNTER — Encounter: Payer: Medicare Other | Admitting: *Deleted

## 2017-08-25 DIAGNOSIS — E119 Type 2 diabetes mellitus without complications: Secondary | ICD-10-CM | POA: Diagnosis not present

## 2017-08-26 DIAGNOSIS — M129 Arthropathy, unspecified: Secondary | ICD-10-CM | POA: Diagnosis not present

## 2017-08-26 DIAGNOSIS — L400 Psoriasis vulgaris: Secondary | ICD-10-CM | POA: Diagnosis not present

## 2017-08-26 DIAGNOSIS — Z23 Encounter for immunization: Secondary | ICD-10-CM | POA: Diagnosis not present

## 2017-08-27 ENCOUNTER — Other Ambulatory Visit: Payer: Self-pay

## 2017-08-27 ENCOUNTER — Encounter: Payer: Self-pay | Admitting: Speech Pathology

## 2017-08-27 ENCOUNTER — Ambulatory Visit: Payer: Medicare Other | Admitting: Speech Pathology

## 2017-08-27 DIAGNOSIS — R1311 Dysphagia, oral phase: Secondary | ICD-10-CM

## 2017-08-27 DIAGNOSIS — R471 Dysarthria and anarthria: Secondary | ICD-10-CM
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
## 2017-09-01 ENCOUNTER — Other Ambulatory Visit: Payer: Self-pay

## 2017-09-01 ENCOUNTER — Ambulatory Visit: Payer: Medicare Other

## 2017-09-01 DIAGNOSIS — R471 Dysarthria and anarthria: Secondary | ICD-10-CM | POA: Diagnosis not present

## 2017-09-01 DIAGNOSIS — R1311 Dysphagia, oral phase: Secondary | ICD-10-CM

## 2017-09-02 NOTE — Patient Instructions (Signed)
With worsening swallow or speech symptoms, contact your primary care doctor
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
## 2017-09-16 DIAGNOSIS — R2681 Unsteadiness on feet: Secondary | ICD-10-CM | POA: Diagnosis not present

## 2017-09-16 DIAGNOSIS — Z86010 Personal history of colonic polyps: Secondary | ICD-10-CM | POA: Diagnosis not present

## 2017-09-16 DIAGNOSIS — E119 Type 2 diabetes mellitus without complications: Secondary | ICD-10-CM | POA: Diagnosis not present

## 2017-09-16 DIAGNOSIS — E782 Mixed hyperlipidemia: Secondary | ICD-10-CM | POA: Diagnosis not present

## 2017-10-12 DIAGNOSIS — M542 Cervicalgia: Secondary | ICD-10-CM | POA: Diagnosis not present

## 2017-10-12 DIAGNOSIS — M50123 Cervical disc disorder at C6-C7 level with radiculopathy: Secondary | ICD-10-CM | POA: Diagnosis not present

## 2017-10-12 DIAGNOSIS — R51 Headache: Secondary | ICD-10-CM | POA: Diagnosis not present

## 2017-10-15 ENCOUNTER — Telehealth: Payer: Self-pay | Admitting: Neurology

## 2017-10-15 NOTE — Telephone Encounter (Signed)
Instructed them to call the office where he is having speech therapy to talk about this denial.

## 2017-10-15 NOTE — Telephone Encounter (Signed)
Patient got a notice of denial for payment for the speech therapy from [facility] and they were told by someone in our office that the prior approval was  done please call

## 2017-10-21 DIAGNOSIS — R531 Weakness: Secondary | ICD-10-CM | POA: Diagnosis not present

## 2017-10-21 DIAGNOSIS — M2550 Pain in unspecified joint: Secondary | ICD-10-CM | POA: Diagnosis not present

## 2017-10-21 DIAGNOSIS — L409 Psoriasis, unspecified: Secondary | ICD-10-CM | POA: Diagnosis not present

## 2017-10-21 DIAGNOSIS — M791 Myalgia, unspecified site: Secondary | ICD-10-CM | POA: Diagnosis not present

## 2017-10-22 DIAGNOSIS — R259 Unspecified abnormal involuntary movements: Secondary | ICD-10-CM | POA: Diagnosis not present

## 2017-10-22 DIAGNOSIS — R200 Anesthesia of skin: Secondary | ICD-10-CM | POA: Diagnosis not present

## 2017-10-22 DIAGNOSIS — R42 Dizziness and giddiness: Secondary | ICD-10-CM | POA: Diagnosis not present

## 2017-12-01 DIAGNOSIS — H401131 Primary open-angle glaucoma, bilateral, mild stage: Secondary | ICD-10-CM | POA: Diagnosis not present

## 2018-02-15 DIAGNOSIS — M791 Myalgia, unspecified site: Secondary | ICD-10-CM | POA: Diagnosis not present

## 2018-02-15 DIAGNOSIS — M2550 Pain in unspecified joint: Secondary | ICD-10-CM | POA: Diagnosis not present

## 2018-02-15 DIAGNOSIS — L409 Psoriasis, unspecified: Secondary | ICD-10-CM | POA: Diagnosis not present

## 2018-02-15 DIAGNOSIS — R531 Weakness: Secondary | ICD-10-CM | POA: Diagnosis not present

## 2018-03-02 DIAGNOSIS — H401131 Primary open-angle glaucoma, bilateral, mild stage: Secondary | ICD-10-CM | POA: Diagnosis not present

## 2018-03-17 DIAGNOSIS — G4730 Sleep apnea, unspecified: Secondary | ICD-10-CM | POA: Diagnosis not present

## 2018-03-17 DIAGNOSIS — Z86010 Personal history of colonic polyps: Secondary | ICD-10-CM | POA: Diagnosis not present

## 2018-03-17 DIAGNOSIS — E119 Type 2 diabetes mellitus without complications: Secondary | ICD-10-CM | POA: Diagnosis not present

## 2018-03-17 DIAGNOSIS — I10 Essential (primary) hypertension: Secondary | ICD-10-CM | POA: Diagnosis not present

## 2018-05-24 ENCOUNTER — Encounter: Payer: Self-pay | Admitting: Cardiology

## 2018-05-31 DIAGNOSIS — H401131 Primary open-angle glaucoma, bilateral, mild stage: Secondary | ICD-10-CM | POA: Diagnosis not present

## 2018-06-07 ENCOUNTER — Telehealth: Payer: Self-pay | Admitting: *Deleted

## 2018-06-07 ENCOUNTER — Ambulatory Visit: Payer: Medicare Other | Admitting: Cardiology

## 2018-06-07 ENCOUNTER — Encounter: Payer: Self-pay | Admitting: Cardiology

## 2018-06-07 VITALS — BP 146/80 | HR 59 | Ht 69.5 in | Wt 207.6 lb

## 2018-06-07 DIAGNOSIS — G4733 Obstructive sleep apnea (adult) (pediatric): Secondary | ICD-10-CM

## 2018-06-07 DIAGNOSIS — Z9989 Dependence on other enabling machines and devices: Secondary | ICD-10-CM

## 2018-06-07 DIAGNOSIS — I10 Essential (primary) hypertension: Secondary | ICD-10-CM | POA: Diagnosis not present

## 2018-06-07 DIAGNOSIS — E669 Obesity, unspecified: Secondary | ICD-10-CM

## 2018-06-07 NOTE — Telephone Encounter (Signed)
-----   Message from Sarina Ill, RN sent at 06/07/2018 10:07 AM EDT ----- Regarding: Sleep Supplies Hello, The patient needs his CPAP pressure decreased to 6 cm H20 and a download in 2 weeks. Thanks, [employer]

## 2018-06-07 NOTE — Telephone Encounter (Signed)
Order faxed to [facility] and sent via community messsage

## 2018-06-07 NOTE — Patient Instructions (Signed)
Medication Instructions:  Your physician recommends that you continue on your current medications as directed. Please refer to the Current Medication list given to you today.  Follow-Up: Your physician wants you to follow-up in: 1 year with Dr. Radford Pax. You will receive a reminder letter in the mail two months in advance. If you don't receive a letter, please call our office to schedule the follow-up appointment.   If you need a refill on your cardiac medications before your next appointment, please call your pharmacy.  Your CPAP machine pressure will be decrease to 6 cm H2O. The sleep coordinator will follow up with you.

## 2018-06-07 NOTE — Progress Notes (Signed)
Cardiology Office Note:    Date:  06/07/2018   ID:  Corey Gill, DOB 07/28/1951, MRN [id number]  PCP:  Hulan Fess, MD  Cardiologist:  Fransico Him, MD    Referring MD: Hulan Fess, MD   Chief Complaint  Patient presents with  . Sleep Apnea  . Hypertension    History of Present Illness:    Corey Gill is a 67 y.o. male with a hx of OSA on CPAP, HTN and obesity.  He is doing well with his CPAP device except that he is having a lot of problems with mouth dryness and feels like the pressure is really high when he wakes up.  He is using distilled water in his device but has not adjusted his humidity dial.  He also does not wear a chinstrap.Marland Kitchen  He tolerates the mask.  Since going on CPAP he feels rested in the am and has no significant daytime sleepiness.   He does not think that he snores.     Past Medical History:  Diagnosis Date  . 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 Diabetes mellitus without complication ([place])   . Diverticulosis   . Essential hypertension   . GERD (gastroesophageal reflux disease)   . Glaucoma    Bilateral  . Headache   . History of colonoscopy   . OSA on CPAP   . Psoriasis     Past Surgical History:  Procedure Laterality Date  . APPENDECTOMY    . COLON SURGERY  2007   Due to tumor, benign  . HERNIA REPAIR  2006   Right inguinal  . SPINE SURGERY  1982   Due to whiplash injury, cervical    Current Medications: Current Meds  Medication Sig  . acetaminophen (TYLENOL) 500 MG tablet Take 500 mg by mouth every 8 (eight) hours as needed (migraines). Pt states he takes this just a few times a year  . amLODipine (NORVASC) 2.5 MG tablet Take 2.5 mg by mouth daily.  . carboxymethylcellulose (REFRESH PLUS) 0.5 % SOLN Place 1 drop into both eyes daily as needed (dry eyes).  . Cholecalciferol (VITAMIN D) 2000 UNITS CAPS Take 2,000 Units by mouth daily.  . dorzolamide (TRUSOPT) 2 % ophthalmic solution 1 drop 2 (two) times daily.   .  halobetasol (ULTRAVATE) 0.05 % ointment Apply topically daily.  Marland Kitchen lisinopril (PRINIVIL,ZESTRIL) 40 MG tablet Take 40 mg by mouth daily.  . metFORMIN (GLUCOPHAGE) 500 MG tablet Take 500 mg by mouth daily with breakfast.  . mometasone (ELOCON) 0.1 % ointment Apply 1 application topically daily.  Marland Kitchen omeprazole (PRILOSEC) 20 MG capsule Take 20 mg by mouth daily.  . pravastatin (PRAVACHOL) 10 MG tablet Take 10 mg by mouth daily.  . Pseudoephedrine HCl (SUDAFED 24 HOUR NON-DROWSY) 240 MG TB24 Take 1 tablet by mouth as needed. Headaches, takes with the tylenol  . timolol (BETIMOL) 0.5 % ophthalmic solution Place 2 drops into both eyes daily.     Allergies:   Rosuvastatin and Asa [aspirin]   Social History   Socioeconomic History  . Marital status: Single    Spouse name: Not on file  . Number of children: Not on file  . Years of education: 27  . Highest education level: Not on file  Occupational History  . Occupation: retired    Comment: Librarian, academic  Social Needs  . Financial resource strain: Not on file  . Food insecurity:    Worry: Not on file    Inability: Not on file  .  Transportation needs:    Medical: Not on file    Non-medical: Not on file  Tobacco Use  . Smoking status: Former Smoker    Last attempt to quit: 09/15/1993    Years since quitting: 24.7  . Smokeless tobacco: Never Used  Substance and Sexual Activity  . Alcohol use: No  . Drug use: No  . Sexual activity: Not on file  Lifestyle  . Physical activity:    Days per week: Not on file    Minutes per session: Not on file  . Stress: Not on file  Relationships  . Social connections:    Talks on phone: Not on file    Gets together: Not on file    Attends religious service: Not on file    Active member of club or organization: Not on file    Attends meetings of clubs or organizations: Not on file    Relationship status: Not on file  Other Topics Concern  . Not on file  Social History Narrative  . Not on file      Family History: The patient's family history includes Angina in his sister; Colon polyps in his mother; GER disease in his mother; Parkinson's disease in his father; Prostate cancer in his brother and father; Skin cancer in his mother.  ROS:   Please see the history of present illness.    ROS  All other systems reviewed and negative.   EKGs/Labs/Other Studies Reviewed:    The following studies were reviewed today: PAP download  EKG:  EKG is not ordered today.   Recent Labs: No results found for requested labs within last 8760 hours.   Recent Lipid Panel No results found for: CHOL, TRIG, HDL, CHOLHDL, VLDL, LDLCALC, LDLDIRECT  Physical Exam:    VS:  BP (!) 146/80   Pulse (!) 59   Ht 5' 9.5" (1.765 m)   Wt 207 lb 9.6 oz (94.2 kg)   SpO2 96%   BMI 30.22 kg/m     Wt Readings from Last 3 Encounters:  06/07/18 207 lb 9.6 oz (94.2 kg)  06/02/17 207 lb 6.4 oz (94.1 kg)  04/30/17 204 lb (92.5 kg)     GEN:  Well nourished, well developed in no acute distress HEENT: Normal NECK: No JVD; No carotid bruits LYMPHATICS: No lymphadenopathy CARDIAC: RRR, no murmurs, rubs, gallops RESPIRATORY:  Clear to auscultation without rales, wheezing or rhonchi  ABDOMEN: Soft, non-tender, non-distended MUSCULOSKELETAL:  No edema; No deformity  SKIN: Warm and dry NEUROLOGIC:  Alert and oriented x 3 PSYCHIATRIC:  Normal affect   ASSESSMENT:    1. OSA on CPAP   2. Essential hypertension   3. Obesity (BMI 30-39.9)    PLAN:    In order of problems listed above:  1.  OSA - the patient is tolerating PAP therapy well without any problems. The PAP download was reviewed today and showed an AHI of 0.thank you 8/hr on 7 cm H2O with 69% compliance in using more than 4 hours nightly.  The patient has been using and benefiting from PAP use and will continue to benefit from therapy.  I encouraged him to be more compliant.  I have instructed him to get his instruction booklet out and adjust the  humidity to see if that helps with his mouth dryness.  I have also ordered a chinstrap.  I am going to decrease his CPAP to 6 cm H2O and get a download in 2 weeks to see if that helps and  tolerate device more.  2.  HTN - BP is borderline controlled on exam today.  He will continue on lisinopril 40 mg daily and amlodipine 2.5 mg daily.  I have asked him to check BP daily for a week and call with the results.   3.  Obesity - I have encouraged him to get into a routine exercise program and cut back on carbs and portions.    Medication Adjustments/Labs and Tests Ordered: Current medicines are reviewed at length with the patient today.  Concerns regarding medicines are outlined above.  No orders of the defined types were placed in this encounter.  No orders of the defined types were placed in this encounter.   Signed, Fransico Him, MD  06/07/2018 10:00 AM    [place]

## 2018-06-21 DIAGNOSIS — G4733 Obstructive sleep apnea (adult) (pediatric): Secondary | ICD-10-CM | POA: Diagnosis not present

## 2018-07-14 ENCOUNTER — Telehealth: Payer: Self-pay | Admitting: *Deleted

## 2018-07-14 NOTE — Telephone Encounter (Signed)
-----   Message from Sueanne Margarita, MD sent at 07/11/2018  2:43 PM EDT ----- Good AHI on PAP but needs to improve compliance

## 2018-07-14 NOTE — Telephone Encounter (Signed)
Informed patient of compliance results and verbalized understanding was indicated. Patient is aware and agreeable to AHI being within range at 0.7. Patient is aware and agreeable to improving in compliance with machine usage.

## 2018-08-26 DIAGNOSIS — L400 Psoriasis vulgaris: Secondary | ICD-10-CM | POA: Diagnosis not present

## 2018-08-30 DIAGNOSIS — H401131 Primary open-angle glaucoma, bilateral, mild stage: Secondary | ICD-10-CM | POA: Diagnosis not present

## 2018-08-30 DIAGNOSIS — E119 Type 2 diabetes mellitus without complications: Secondary | ICD-10-CM | POA: Diagnosis not present

## 2018-09-09 DIAGNOSIS — E1169 Type 2 diabetes mellitus with other specified complication: Secondary | ICD-10-CM | POA: Diagnosis not present

## 2018-09-09 DIAGNOSIS — R0602 Shortness of breath: Secondary | ICD-10-CM | POA: Diagnosis not present

## 2018-09-09 DIAGNOSIS — I10 Essential (primary) hypertension: Secondary | ICD-10-CM | POA: Diagnosis not present

## 2018-09-09 DIAGNOSIS — R0789 Other chest pain: Secondary | ICD-10-CM | POA: Diagnosis not present

## 2018-09-24 DIAGNOSIS — E1169 Type 2 diabetes mellitus with other specified complication: Secondary | ICD-10-CM | POA: Diagnosis not present

## 2018-09-24 DIAGNOSIS — E782 Mixed hyperlipidemia: Secondary | ICD-10-CM | POA: Diagnosis not present

## 2018-09-24 DIAGNOSIS — Z8042 Family history of malignant neoplasm of prostate: Secondary | ICD-10-CM | POA: Diagnosis not present

## 2018-09-24 DIAGNOSIS — Z86010 Personal history of colonic polyps: Secondary | ICD-10-CM | POA: Diagnosis not present

## 2018-09-27 ENCOUNTER — Other Ambulatory Visit: Payer: Self-pay | Admitting: Family Medicine

## 2018-09-27 DIAGNOSIS — Z87891 Personal history of nicotine dependence: Secondary | ICD-10-CM

## 2018-09-28 ENCOUNTER — Ambulatory Visit
Admission: RE | Admit: 2018-09-28 | Discharge: 2018-09-28 | Disposition: A | Discharge status: Home / Self Care | Payer: Medicare Other | Source: Ambulatory Visit | Attending: Family Medicine | Admitting: Family Medicine

## 2018-09-28 DIAGNOSIS — Z87891 Personal history of nicotine dependence: Secondary | ICD-10-CM

## 2018-09-28 DIAGNOSIS — Z136 Encounter for screening for cardiovascular disorders: Secondary | ICD-10-CM | POA: Diagnosis not present

## 2018-09-28 DIAGNOSIS — Z720 Tobacco use: Secondary | ICD-10-CM | POA: Diagnosis not present

## 2018-11-29 DIAGNOSIS — H401131 Primary open-angle glaucoma, bilateral, mild stage: Secondary | ICD-10-CM | POA: Diagnosis not present

## 2018-11-29 DIAGNOSIS — H25042 Posterior subcapsular polar age-related cataract, left eye: Secondary | ICD-10-CM | POA: Diagnosis not present

## 2018-12-30 DIAGNOSIS — E1169 Type 2 diabetes mellitus with other specified complication: Secondary | ICD-10-CM | POA: Diagnosis not present

## 2019-02-28 DIAGNOSIS — H401131 Primary open-angle glaucoma, bilateral, mild stage: Secondary | ICD-10-CM | POA: Diagnosis not present

## 2019-04-06 DIAGNOSIS — E119 Type 2 diabetes mellitus without complications: Secondary | ICD-10-CM | POA: Diagnosis not present

## 2019-05-30 DIAGNOSIS — H401131 Primary open-angle glaucoma, bilateral, mild stage: Secondary | ICD-10-CM | POA: Diagnosis not present

## 2019-06-09 ENCOUNTER — Ambulatory Visit: Payer: Medicare Other | Admitting: Cardiology

## 2019-06-26 ENCOUNTER — Telehealth: Payer: Self-pay | Admitting: *Deleted

## 2019-06-26 NOTE — Telephone Encounter (Signed)

## 2019-06-28 ENCOUNTER — Encounter: Payer: Self-pay | Admitting: Cardiology

## 2019-06-28 ENCOUNTER — Telehealth (INDEPENDENT_AMBULATORY_CARE_PROVIDER_SITE_OTHER): Payer: Medicare Other | Admitting: Cardiology

## 2019-06-28 ENCOUNTER — Other Ambulatory Visit: Payer: Self-pay

## 2019-06-28 VITALS — BP 145/73 | HR 60 | Ht 69.5 in | Wt 200.0 lb

## 2019-06-28 DIAGNOSIS — G4733 Obstructive sleep apnea (adult) (pediatric): Secondary | ICD-10-CM

## 2019-06-28 DIAGNOSIS — R0602 Shortness of breath: Secondary | ICD-10-CM

## 2019-06-28 DIAGNOSIS — Z9989 Dependence on other enabling machines and devices: Secondary | ICD-10-CM

## 2019-06-28 DIAGNOSIS — I10 Essential (primary) hypertension: Secondary | ICD-10-CM

## 2019-06-28 DIAGNOSIS — E669 Obesity, unspecified: Secondary | ICD-10-CM | POA: Diagnosis not present

## 2019-06-28 DIAGNOSIS — Z01812 Encounter for preprocedural laboratory examination: Secondary | ICD-10-CM

## 2019-06-28 DIAGNOSIS — E119 Type 2 diabetes mellitus without complications: Secondary | ICD-10-CM

## 2019-06-28 DIAGNOSIS — R079 Chest pain, unspecified: Secondary | ICD-10-CM

## 2019-06-28 MED ORDER — AMLODIPINE BESYLATE 5 MG PO TABS: 5.0000 mg | ORAL_TABLET | Freq: Every day | ORAL | 3 refills | Status: DC

## 2019-06-28 NOTE — Patient Instructions (Signed)
Medication Instructions:  Your physician has recommended you make the following change in your medication:  1.) increase amlodipine to 5 mg once a day  If you need a refill on your cardiac medications before your next appointment, please call your pharmacy.   Lab work: none If you have labs (blood work) drawn today and your tests are completely normal, you will receive your results only by: Marland Kitchen MyChart Message (if you have MyChart) OR . A paper copy in the mail If you have any lab test that is abnormal or we need to change your treatment, we will call you to review the results.  Testing/Procedures: Your physician has requested that you have an echocardiogram. Echocardiography is a painless test that uses sound waves to create images of your heart. It provides your doctor with information about the size and shape of your heart and how well your heart's chambers and valves are working. This procedure takes approximately one hour. There are no restrictions for this procedure.  Your physician has requested that you have cardiac CT. Cardiac computed tomography (CT) is a painless test that uses an x-ray machine to take clear, detailed pictures of your heart. For further information please visit [email or web address]. Please follow instruction sheet as given.   Follow-Up: At [facility], you and your health needs are our priority.  As part of our continuing mission to provide you with exceptional heart care, we have created designated Provider Care Teams.  These Care Teams include your primary Cardiologist (physician) and Advanced Practice Providers (APPs -  Physician Assistants and Nurse Practitioners) who all work together to provide you with the care you need, when you need it. You will need a follow up appointment in 12 months.  Please call our office 2 months in advance to schedule this appointment.  You may see Fransico Him, MD or one of the following Advanced Practice Providers on your designated  Care Team:   [place], PA-C Melina Copa, PA-C . Ermalinda Barrios, PA-C  Any Other Special Instructions Will Be Listed Below (If Applicable). Please check blood pressure on higher dose of amlodipine - check once a day at lunch and call in one week or send MyChart message to Dr. Radford Pax with the readings.

## 2019-06-28 NOTE — Progress Notes (Addendum)
Virtual Visit via Telephone Note   This visit type was conducted due to national recommendations for restrictions regarding the COVID-19 Pandemic (e.g. social distancing) in an effort to limit this patient's exposure and mitigate transmission in our community.  Due to his co-morbid illnesses, this patient is at least at moderate risk for complications without adequate follow up.  This format is felt to be most appropriate for this patient at this time.  All issues noted in this document were discussed and addressed.  A limited physical exam was performed with this format.  Please refer to the patient's chart for his consent to telehealth for [facility].   Evaluation Performed:  Follow-up visit  This visit type was conducted due to national recommendations for restrictions regarding the COVID-19 Pandemic (e.g. social distancing).  This format is felt to be most appropriate for this patient at this time.  All issues noted in this document were discussed and addressed.  No physical exam was performed (except for noted visual exam findings with Video Visits).  Please refer to the patient's chart (MyChart message for video visits and phone note for telephone visits) for the patient's consent to telehealth for [facility].  Date:  06/28/2019   ID:  Corey Gill, DOB 1951-06-20, MRN [id number]  Patient Location:  Home  Provider location:   [place]  PCP:  Hulan Fess, MD  Sleep Medicine:  Fransico Him, MD   Electrophysiologist:  None   Chief Complaint:  OSA  History of Present Illness:    Corey Gill is a 68 y.o. male who presents via audio/video conferencing for a telehealth visit today.    Corey Gill is a 68 y.o. male with a hx of OSAon CPAP, HTN and obesity.  He is doing well with his CPAP device and thinks that he has gotten used to it.  He tolerates the mask and feels the pressure is adequate.  Since going on CPAP he feels rested in the am and has no significant  daytime sleepiness.  He denies any significant mouth or nasal dryness.  He does have some nasal congestion.  He does not think that he snores.    He had a bad Pneumonia in January and ever since then he has had a bad dry cough.  He now is having SOB to the point he feels he cannot take a deep breath in.  This is associated with chest pressure.  He cannot tell if it is coming from his lung but is worried it could be his heart. He has no fm hx of CAD but is a remote smoker.  The patient does not have symptoms concerning for COVID-19 infection (fever, chills, cough, or new shortness of breath).   Prior CV studies:   The following studies were reviewed today:  PAP compliance download  Past Medical History:  Diagnosis Date  . 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 Diabetes mellitus without complication ([place])   . Diverticulosis   . Essential hypertension   . GERD (gastroesophageal reflux disease)   . Glaucoma    Bilateral  . Headache   . History of colonoscopy   . OSA on CPAP   . Psoriasis    Past Surgical History:  Procedure Laterality Date  . APPENDECTOMY    . COLON SURGERY  2007   Due to tumor, benign  . HERNIA REPAIR  2006   Right inguinal  . SPINE SURGERY  1982   Due to whiplash injury, cervical     Current  Meds  Medication Sig  . acetaminophen (TYLENOL) 500 MG tablet Take 500 mg by mouth every 8 (eight) hours as needed (migraines). Pt states he takes this just a few times a year  . amLODipine (NORVASC) 2.5 MG tablet Take 2.5 mg by mouth daily.  . carboxymethylcellulose (REFRESH PLUS) 0.5 % SOLN Place 1 drop into both eyes daily as needed (dry eyes).  . Cholecalciferol (VITAMIN D) 2000 UNITS CAPS Take 2,000 Units by mouth daily.  . dorzolamide (TRUSOPT) 2 % ophthalmic solution 1 drop 2 (two) times daily.   Marland Kitchen glimepiride (AMARYL) 1 MG tablet Take 1 mg by mouth daily.  . halobetasol (ULTRAVATE) 0.05 % ointment Apply topically daily.  Marland Kitchen lisinopril (PRINIVIL,ZESTRIL)  40 MG tablet Take 40 mg by mouth daily.  . mometasone (ELOCON) 0.1 % ointment Apply 1 application topically daily.  Marland Kitchen omeprazole (PRILOSEC) 20 MG capsule Take 20 mg by mouth daily.  . pravastatin (PRAVACHOL) 10 MG tablet Take 10 mg by mouth daily.  . Pseudoephedrine HCl (SUDAFED 24 HOUR NON-DROWSY) 240 MG TB24 Take 1 tablet by mouth as needed. Headaches, takes with the tylenol  . timolol (BETIMOL) 0.5 % ophthalmic solution Place 2 drops into both eyes daily.     Allergies:   Rosuvastatin, Rosuvastatin calcium, Asa [aspirin], and Latanoprost   Social History   Tobacco Use  . Smoking status: Former Smoker    Quit date: 09/15/1993    Years since quitting: 25.8  . Smokeless tobacco: Never Used  Substance Use Topics  . Alcohol use: No  . Drug use: No     Family Hx: The patient's family history includes Angina in his sister; Colon polyps in his mother; GER disease in his mother; Parkinson's disease in his father; Prostate cancer in his brother and father; Skin cancer in his mother.  ROS:   Please see the history of present illness.     All other systems reviewed and are negative.  Labs/Other Tests and Data Reviewed:    Recent Labs: No results found for requested labs within last 8760 hours.   Recent Lipid Panel No results found for: CHOL, TRIG, HDL, CHOLHDL, LDLCALC, LDLDIRECT  Wt Readings from Last 3 Encounters:  06/28/19 200 lb (90.7 kg)  06/07/18 207 lb 9.6 oz (94.2 kg)  06/02/17 207 lb 6.4 oz (94.1 kg)     Objective:    Vital Signs:  BP (!) 145/73   Pulse 60   Ht 5' 9.5" (1.765 m)   Wt 200 lb (90.7 kg)   BMI 29.11 kg/m     ASSESSMENT & PLAN:    1.  OSA -  The patient is tolerating PAP therapy well without any problems. The PAP download was reviewed today and showed an AHI of 0.6/hr on 6 cm H2O with 76% compliance in using more than 4 hours nightly.  The patient has been using and benefiting from PAP use and will continue to benefit from therapy.   I encouraged him  to try nasal saline spray twice daily for nasal congestion. He thinks that his device is old and would like a new device.  I will order a ResMed CPAP at 6cm H2O with heated humidity and mask of choice.  2.  HTN -BP borderline controlled -SBP has been running in the 140-150's at home and goal with DM is < 130/53mmHg -continue Lisinopril 40mg  daily -increase amlodipine to 5mg  daily -check BP daily for a week and call with results  3.  Obesity -I have encouraged him  to get into a routine exercise program and cut back on carbs and portions.   4. Type 2 DM -followed by PCP -continue Glimepiride 1mg  daily and ACE I  5.  CP/SOB -sx sound more pulmonary -hard to take a deep breath in and then feels tightness -sx of chronic cough since his PNA in January -he does have CFRs including DM, HTN and remote tobacco use -I will get a coronary CTA with FFR for further assessment   COVID-19 Education: The signs and symptoms of COVID-19 were discussed with the patient and how to seek care for testing (follow up with PCP or arrange E-visit).  The importance of social distancing was discussed today.  Patient Risk:   After full review of this patient's clinical status, I feel that they are at least moderate risk at this time.  Time:   Today, I have spent 20 minutes directly with the patient on telemedicine discussing medical problems including OSA, HTN.  We also reviewed the symptoms of COVID 19 and the ways to protect against contracting the virus with telehealth technology.  I spent an additional 5 minutes reviewing patient's chart including PAP compliance download.  Medication Adjustments/Labs and Tests Ordered: Current medicines are reviewed at length with the patient today.  Concerns regarding medicines are outlined above.  Tests Ordered: No orders of the defined types were placed in this encounter.  Medication Changes: No orders of the defined types were placed in this encounter.    Disposition:  Follow up in 1 year(s)  Signed, Fransico Him, MD  06/28/2019 1:58 PM    [facility]

## 2019-06-28 NOTE — Addendum Note (Signed)
Addended by: Rodman Key on: 06/28/2019 04:58 PM   Modules accepted: Orders

## 2019-06-29 ENCOUNTER — Telehealth: Payer: Self-pay | Admitting: *Deleted

## 2019-06-29 DIAGNOSIS — Z9989 Dependence on other enabling machines and devices: Secondary | ICD-10-CM

## 2019-06-29 DIAGNOSIS — G4733 Obstructive sleep apnea (adult) (pediatric): Secondary | ICD-10-CM

## 2019-06-29 NOTE — Telephone Encounter (Signed)
Order placed to [employer] via community message. 

## 2019-06-29 NOTE — Telephone Encounter (Signed)
-----   Message from Sueanne Margarita, MD sent at 06/28/2019  1:59 PM EDT ----- Please order new ResMed CPAP at 6cm H2O with heated humidity and mask of choice.

## 2019-06-30 ENCOUNTER — Other Ambulatory Visit: Payer: Self-pay

## 2019-06-30 ENCOUNTER — Telehealth: Payer: Self-pay | Admitting: *Deleted

## 2019-06-30 ENCOUNTER — Ambulatory Visit (HOSPITAL_COMMUNITY): Payer: Medicare Other | Attending: Cardiovascular Disease

## 2019-06-30 DIAGNOSIS — E119 Type 2 diabetes mellitus without complications: Secondary | ICD-10-CM | POA: Insufficient documentation

## 2019-06-30 DIAGNOSIS — E669 Obesity, unspecified: Secondary | ICD-10-CM | POA: Diagnosis not present

## 2019-06-30 DIAGNOSIS — Z9989 Dependence on other enabling machines and devices: Secondary | ICD-10-CM | POA: Diagnosis not present

## 2019-06-30 DIAGNOSIS — R0602 Shortness of breath: Secondary | ICD-10-CM | POA: Diagnosis not present

## 2019-06-30 DIAGNOSIS — I10 Essential (primary) hypertension: Secondary | ICD-10-CM | POA: Diagnosis not present

## 2019-06-30 DIAGNOSIS — G4733 Obstructive sleep apnea (adult) (pediatric): Secondary | ICD-10-CM | POA: Insufficient documentation

## 2019-06-30 DIAGNOSIS — R079 Chest pain, unspecified: Secondary | ICD-10-CM | POA: Diagnosis not present

## 2019-06-30 NOTE — Telephone Encounter (Signed)
Informed patient of compliance results and verbalized understanding was indicated. Patient is aware and agreeable to AHI being within range at 0.6. Patient is aware and agreeable to being in compliance with machine usage. Patient is aware and agreeable to no change in current pressures 

## 2019-06-30 NOTE — Telephone Encounter (Signed)
-----   Message from Sueanne Margarita, MD sent at 06/28/2019 10:53 AM EDT ----- Good AHI and compliance.  Continue current PAP settings.

## 2019-07-11 DIAGNOSIS — H5340 Unspecified visual field defects: Secondary | ICD-10-CM | POA: Diagnosis not present

## 2019-07-11 DIAGNOSIS — H401131 Primary open-angle glaucoma, bilateral, mild stage: Secondary | ICD-10-CM | POA: Diagnosis not present

## 2019-07-15 ENCOUNTER — Other Ambulatory Visit: Payer: Self-pay | Admitting: Optometry

## 2019-07-15 DIAGNOSIS — H53452 Other localized visual field defect, left eye: Secondary | ICD-10-CM

## 2019-08-08 ENCOUNTER — Other Ambulatory Visit: Payer: Self-pay

## 2019-08-08 ENCOUNTER — Ambulatory Visit
Admission: RE | Admit: 2019-08-08 | Discharge: 2019-08-08 | Disposition: A | Discharge status: Home / Self Care | Payer: Medicare Other | Source: Ambulatory Visit | Attending: Optometry | Admitting: Optometry

## 2019-08-08 DIAGNOSIS — H53452 Other localized visual field defect, left eye: Secondary | ICD-10-CM

## 2019-08-08 DIAGNOSIS — H547 Unspecified visual loss: Secondary | ICD-10-CM | POA: Diagnosis not present

## 2019-08-08 MED ORDER — GADOBENATE DIMEGLUMINE 529 MG/ML IV SOLN
20.0000 mL | Freq: Once | INTRAVENOUS | Status: AC | PRN
  Administered 2019-08-08: 20 mL via INTRAVENOUS

## 2019-08-15 ENCOUNTER — Telehealth: Payer: Self-pay | Admitting: Cardiology

## 2019-08-15 NOTE — Telephone Encounter (Signed)
I spoke to the patient and advised him that there are no real restrictions, except that he wears a mask at the appointment.  He verbalized understanding.

## 2019-08-15 NOTE — Telephone Encounter (Signed)
New Message    Pt is calling and is wondering if he has any restrictions for his lab appt    Please call

## 2019-08-16 ENCOUNTER — Other Ambulatory Visit: Payer: Medicare Other

## 2019-08-16 ENCOUNTER — Other Ambulatory Visit: Payer: Self-pay

## 2019-08-16 DIAGNOSIS — Z01812 Encounter for preprocedural laboratory examination: Secondary | ICD-10-CM

## 2019-08-16 LAB — BASIC METABOLIC PANEL
BUN/Creatinine Ratio: 12 (ref 10–24)
BUN: 12 mg/dL (ref 8–27)
CO2: 24 mmol/L (ref 20–29)
Calcium: 9.1 mg/dL (ref 8.6–10.2)
Chloride: 101 mmol/L (ref 96–106)
Creatinine, Ser: 1.03 mg/dL (ref 0.76–1.27)
GFR calc Af Amer: 86 mL/min/{1.73_m2} (ref >59)
GFR calc non Af Amer: 75 mL/min/{1.73_m2} (ref >59)
Glucose: 165 mg/dL — ABNORMAL HIGH (ref 65–99)
Potassium: 4.2 mmol/L (ref 3.5–5.2)
Sodium: 138 mmol/L (ref 134–144)

## 2019-08-17 ENCOUNTER — Telehealth: Payer: Self-pay

## 2019-08-17 NOTE — Telephone Encounter (Signed)
Notes recorded by Frederik Schmidt, RN on 08/17/2019 at 8:04 AM EST  The patient has been notified of the result and verbalized understanding. All questions (if any) were answered.  Frederik Schmidt, RN 08/17/2019 8:04 AM

## 2019-08-17 NOTE — Telephone Encounter (Signed)
-----   Message from Sueanne Margarita, MD sent at 08/16/2019 10:52 PM EST ----- Stable labs - continue current meds and forward to PCP

## 2019-08-22 DIAGNOSIS — H401122 Primary open-angle glaucoma, left eye, moderate stage: Secondary | ICD-10-CM | POA: Diagnosis not present

## 2019-08-22 DIAGNOSIS — H2512 Age-related nuclear cataract, left eye: Secondary | ICD-10-CM | POA: Diagnosis not present

## 2019-08-23 ENCOUNTER — Encounter (HOSPITAL_COMMUNITY): Payer: Self-pay

## 2019-08-24 ENCOUNTER — Telehealth (HOSPITAL_COMMUNITY): Payer: Self-pay | Admitting: Emergency Medicine

## 2019-08-24 NOTE — Telephone Encounter (Signed)
Left message on voicemail with name and callback number Matthews Franks RN Navigator Cardiac Imaging [facility] [phone] Office [phone] Cell  

## 2019-08-25 ENCOUNTER — Encounter (HOSPITAL_COMMUNITY): Payer: Self-pay | Admitting: Emergency Medicine

## 2019-08-25 ENCOUNTER — Observation Stay (HOSPITAL_COMMUNITY)
Admission: EM | Admit: 2019-08-25 | Discharge: 2019-08-26 | Disposition: A | Discharge status: Home / Self Care | Payer: Medicare Other | Attending: Internal Medicine | Admitting: Internal Medicine

## 2019-08-25 ENCOUNTER — Other Ambulatory Visit: Payer: Self-pay

## 2019-08-25 ENCOUNTER — Emergency Department (HOSPITAL_COMMUNITY)
Admission: EM | Admit: 2019-08-25 | Discharge: 2019-08-25 | Disposition: A | Discharge status: Home / Self Care | Payer: Medicare Other | Source: Home / Self Care | Attending: Cardiology | Admitting: Cardiology

## 2019-08-25 ENCOUNTER — Ambulatory Visit (HOSPITAL_COMMUNITY)
Admission: RE | Admit: 2019-08-25 | Discharge: 2019-08-25 | Disposition: A | Discharge status: Home / Self Care | Payer: Medicare Other | Source: Ambulatory Visit | Attending: Cardiology | Admitting: Cardiology

## 2019-08-25 ENCOUNTER — Telehealth: Payer: Self-pay | Admitting: Cardiology

## 2019-08-25 DIAGNOSIS — I2699 Other pulmonary embolism without acute cor pulmonale: Principal | ICD-10-CM | POA: Diagnosis present

## 2019-08-25 DIAGNOSIS — R05 Cough: Secondary | ICD-10-CM | POA: Diagnosis not present

## 2019-08-25 DIAGNOSIS — R079 Chest pain, unspecified: Secondary | ICD-10-CM

## 2019-08-25 DIAGNOSIS — E119 Type 2 diabetes mellitus without complications: Secondary | ICD-10-CM

## 2019-08-25 DIAGNOSIS — I2694 Multiple subsegmental pulmonary emboli without acute cor pulmonale: Secondary | ICD-10-CM

## 2019-08-25 DIAGNOSIS — K219 Gastro-esophageal reflux disease without esophagitis: Secondary | ICD-10-CM | POA: Diagnosis present

## 2019-08-25 DIAGNOSIS — E669 Obesity, unspecified: Secondary | ICD-10-CM | POA: Diagnosis present

## 2019-08-25 DIAGNOSIS — Z20828 Contact with and (suspected) exposure to other viral communicable diseases: Secondary | ICD-10-CM | POA: Insufficient documentation

## 2019-08-25 DIAGNOSIS — Z79899 Other long term (current) drug therapy: Secondary | ICD-10-CM | POA: Diagnosis not present

## 2019-08-25 DIAGNOSIS — Z888 Allergy status to other drugs, medicaments and biological substances status: Secondary | ICD-10-CM | POA: Diagnosis not present

## 2019-08-25 DIAGNOSIS — M1990 Unspecified osteoarthritis, unspecified site: Secondary | ICD-10-CM | POA: Insufficient documentation

## 2019-08-25 DIAGNOSIS — E785 Hyperlipidemia, unspecified: Secondary | ICD-10-CM | POA: Diagnosis present

## 2019-08-25 DIAGNOSIS — G4733 Obstructive sleep apnea (adult) (pediatric): Secondary | ICD-10-CM | POA: Diagnosis not present

## 2019-08-25 DIAGNOSIS — E1139 Type 2 diabetes mellitus with other diabetic ophthalmic complication: Secondary | ICD-10-CM | POA: Diagnosis not present

## 2019-08-25 DIAGNOSIS — Z7984 Long term (current) use of oral hypoglycemic drugs: Secondary | ICD-10-CM | POA: Insufficient documentation

## 2019-08-25 DIAGNOSIS — I2510 Atherosclerotic heart disease of native coronary artery without angina pectoris: Secondary | ICD-10-CM | POA: Insufficient documentation

## 2019-08-25 DIAGNOSIS — Z87891 Personal history of nicotine dependence: Secondary | ICD-10-CM | POA: Diagnosis not present

## 2019-08-25 DIAGNOSIS — H42 Glaucoma in diseases classified elsewhere: Secondary | ICD-10-CM | POA: Insufficient documentation

## 2019-08-25 DIAGNOSIS — I10 Essential (primary) hypertension: Secondary | ICD-10-CM | POA: Diagnosis not present

## 2019-08-25 DIAGNOSIS — Z8673 Personal history of transient ischemic attack (TIA), and cerebral infarction without residual deficits: Secondary | ICD-10-CM | POA: Diagnosis not present

## 2019-08-25 DIAGNOSIS — Z9989 Dependence on other enabling machines and devices: Secondary | ICD-10-CM

## 2019-08-25 DIAGNOSIS — Z6829 Body mass index (BMI) 29.0-29.9, adult: Secondary | ICD-10-CM | POA: Insufficient documentation

## 2019-08-25 DIAGNOSIS — Z886 Allergy status to analgesic agent status: Secondary | ICD-10-CM | POA: Insufficient documentation

## 2019-08-25 DIAGNOSIS — R0789 Other chest pain: Secondary | ICD-10-CM | POA: Diagnosis not present

## 2019-08-25 DIAGNOSIS — I700 Atherosclerosis of aorta: Secondary | ICD-10-CM | POA: Diagnosis not present

## 2019-08-25 LAB — TROPONIN I (HIGH SENSITIVITY)
Troponin I (High Sensitivity): 4 ng/L (ref <18)
Troponin I (High Sensitivity): 4 ng/L (ref <18)

## 2019-08-25 LAB — PROTIME-INR
INR: 1 (ref 0.8–1.2)
Prothrombin Time: 12.9 seconds (ref 11.4–15.2)

## 2019-08-25 LAB — CBC
HCT: 42.2 % (ref 39.0–52.0)
Hemoglobin: 14.2 g/dL (ref 13.0–17.0)
MCH: 29.2 pg (ref 26.0–34.0)
MCHC: 33.6 g/dL (ref 30.0–36.0)
MCV: 86.8 fL (ref 80.0–100.0)
Platelets: 193 10*3/uL (ref 150–400)
RBC: 4.86 MIL/uL (ref 4.22–5.81)
RDW: 12.9 % (ref 11.5–15.5)
WBC: 5.8 10*3/uL (ref 4.0–10.5)
nRBC: 0 % (ref 0.0–0.2)

## 2019-08-25 LAB — BRAIN NATRIURETIC PEPTIDE: B Natriuretic Peptide: 17.6 pg/mL (ref 0.0–100.0)

## 2019-08-25 LAB — BASIC METABOLIC PANEL
Anion gap: 10 (ref 5–15)
BUN: 11 mg/dL (ref 8–23)
CO2: 26 mmol/L (ref 22–32)
Calcium: 9 mg/dL (ref 8.9–10.3)
Chloride: 103 mmol/L (ref 98–111)
Creatinine, Ser: 1.03 mg/dL (ref 0.61–1.24)
GFR calc Af Amer: >60 mL/min (ref >60)
GFR calc non Af Amer: >60 mL/min (ref >60)
Glucose, Bld: 110 mg/dL — ABNORMAL HIGH (ref 70–99)
Potassium: 4 mmol/L (ref 3.5–5.1)
Sodium: 139 mmol/L (ref 135–145)

## 2019-08-25 LAB — LIPID PANEL
Cholesterol: 172 mg/dL (ref 0–200)
HDL: 55 mg/dL (ref >40)
LDL Cholesterol: 103 mg/dL — ABNORMAL HIGH (ref 0–99)
Total CHOL/HDL Ratio: 3.1 RATIO
Triglycerides: 70 mg/dL (ref <150)
VLDL: 14 mg/dL (ref 0–40)

## 2019-08-25 LAB — GLUCOSE, CAPILLARY
Glucose-Capillary: 90 mg/dL (ref 70–99)
Glucose-Capillary: 139 mg/dL — ABNORMAL HIGH (ref 70–99)

## 2019-08-25 LAB — HIV ANTIBODY (ROUTINE TESTING W REFLEX): HIV Screen 4th Generation wRfx: NONREACTIVE

## 2019-08-25 LAB — APTT: aPTT: 34 seconds (ref 24–36)

## 2019-08-25 LAB — HEMOGLOBIN A1C
Hgb A1c MFr Bld: 6.1 % — ABNORMAL HIGH (ref 4.8–5.6)
Mean Plasma Glucose: 128.37 mg/dL

## 2019-08-25 LAB — SARS CORONAVIRUS 2 (TAT 6-24 HRS): SARS Coronavirus 2: NEGATIVE

## 2019-08-25 MED ORDER — PRAVASTATIN SODIUM 10 MG PO TABS
10.0000 mg | ORAL_TABLET | Freq: Every day | ORAL | Status: DC
  Administered 2019-08-25: 10 mg via ORAL
  Filled 2019-08-25: qty 1

## 2019-08-25 MED ORDER — INSULIN ASPART 100 UNIT/ML ~~LOC~~ SOLN: 0.0000 [IU] | Freq: Every day | SUBCUTANEOUS | Status: DC

## 2019-08-25 MED ORDER — HALOBETASOL PROPIONATE 0.05 % EX OINT
1.0000 "application " | TOPICAL_OINTMENT | Freq: Every day | CUTANEOUS | Status: DC
  Administered 2019-08-26: 1 via TOPICAL

## 2019-08-25 MED ORDER — ONDANSETRON HCL 4 MG/2ML IJ SOLN: 4.0000 mg | Freq: Four times a day (QID) | INTRAMUSCULAR | Status: DC | PRN

## 2019-08-25 MED ORDER — PANTOPRAZOLE SODIUM 40 MG PO TBEC
40.0000 mg | DELAYED_RELEASE_TABLET | Freq: Every day | ORAL | Status: DC
  Administered 2019-08-26: 40 mg via ORAL
  Filled 2019-08-25: qty 1

## 2019-08-25 MED ORDER — HEPARIN BOLUS VIA INFUSION
6000.0000 [IU] | Freq: Once | INTRAVENOUS | Status: AC
  Administered 2019-08-25: 6000 [IU] via INTRAVENOUS
  Filled 2019-08-25: qty 6000

## 2019-08-25 MED ORDER — ONDANSETRON HCL 4 MG PO TABS: 4.0000 mg | ORAL_TABLET | Freq: Four times a day (QID) | ORAL | Status: DC | PRN

## 2019-08-25 MED ORDER — ACETAMINOPHEN 650 MG RE SUPP: 650.0000 mg | Freq: Four times a day (QID) | RECTAL | Status: DC | PRN

## 2019-08-25 MED ORDER — NITROGLYCERIN 0.4 MG SL SUBL
0.8000 mg | SUBLINGUAL_TABLET | Freq: Once | SUBLINGUAL | Status: AC
  Administered 2019-08-25: 0.8 mg via SUBLINGUAL

## 2019-08-25 MED ORDER — DORZOLAMIDE HCL 2 % OP SOLN
1.0000 [drp] | Freq: Two times a day (BID) | OPHTHALMIC | Status: DC
  Administered 2019-08-26: 1 [drp] via OPHTHALMIC
  Filled 2019-08-25: qty 10

## 2019-08-25 MED ORDER — IOHEXOL 350 MG/ML SOLN
100.0000 mL | Freq: Once | INTRAVENOUS | Status: AC | PRN
  Administered 2019-08-25: 100 mL via INTRAVENOUS

## 2019-08-25 MED ORDER — TIMOLOL MALEATE 0.5 % OP SOLN
2.0000 [drp] | Freq: Every day | OPHTHALMIC | Status: DC
  Administered 2019-08-26: 2 [drp] via OPHTHALMIC
  Filled 2019-08-25: qty 5

## 2019-08-25 MED ORDER — MOMETASONE FUROATE 0.1 % EX OINT: 1.0000 "application " | TOPICAL_OINTMENT | Freq: Every day | CUTANEOUS | Status: DC

## 2019-08-25 MED ORDER — NITROGLYCERIN 0.4 MG SL SUBL
SUBLINGUAL_TABLET | SUBLINGUAL | Status: AC
  Filled 2019-08-25: qty 2

## 2019-08-25 MED ORDER — LISINOPRIL 20 MG PO TABS
40.0000 mg | ORAL_TABLET | Freq: Every day | ORAL | Status: DC
  Administered 2019-08-25 – 2019-08-26 (×2): 40 mg via ORAL
  Filled 2019-08-25 (×2): qty 2

## 2019-08-25 MED ORDER — ACETAMINOPHEN 325 MG PO TABS: 650.0000 mg | ORAL_TABLET | Freq: Four times a day (QID) | ORAL | Status: DC | PRN

## 2019-08-25 MED ORDER — HEPARIN (PORCINE) 25000 UT/250ML-% IV SOLN
1300.0000 [IU]/h | INTRAVENOUS | Status: DC
  Administered 2019-08-25: 1550 [IU]/h via INTRAVENOUS
  Administered 2019-08-26: 1300 [IU]/h via INTRAVENOUS
  Filled 2019-08-25 (×2): qty 250

## 2019-08-25 MED ORDER — AMLODIPINE BESYLATE 5 MG PO TABS
5.0000 mg | ORAL_TABLET | Freq: Every day | ORAL | Status: DC
  Administered 2019-08-25 – 2019-08-26 (×2): 5 mg via ORAL
  Filled 2019-08-25 (×2): qty 1

## 2019-08-25 MED ORDER — MOMETASONE FUROATE 0.1 % EX CREA
TOPICAL_CREAM | Freq: Every day | CUTANEOUS | Status: DC
  Filled 2019-08-25: qty 15

## 2019-08-25 MED ORDER — INSULIN ASPART 100 UNIT/ML ~~LOC~~ SOLN: 0.0000 [IU] | Freq: Three times a day (TID) | SUBCUTANEOUS | Status: DC

## 2019-08-25 NOTE — Telephone Encounter (Signed)
Pt  and his wife advised and verbalized understanding go to the [facility] ED immediately for treatment of his PE.
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
## 2019-08-25 NOTE — Telephone Encounter (Signed)
New message    Please call Diane at [place] for [place] report [phone]
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
## 2019-08-25 NOTE — Telephone Encounter (Signed)
-----   Message from Sueanne Margarita, MD sent at 08/25/2019 12:39 PM EST ----- Please let patient know that Chest CT showed pulmonary embolism in several segments of the left upper lobe of his lung.  He needs to go to [facility] immediately for further evaluation.  He also has mild CAD that is likely not obstructive but I have sent study for flow analysis.  Please get a copy of last FLP from PCP.  Continue statin.

## 2019-08-25 NOTE — Progress Notes (Signed)
Patient refused CPAP for hospital stay.

## 2019-08-25 NOTE — Progress Notes (Signed)
ANTICOAGULATION CONSULT NOTE - Initial Consult  Pharmacy Consult for Heparin  Indication: pulmonary embolus  Allergies  Allergen Reactions  . Rosuvastatin Other (See Comments)    Tired and severe muscle aches  . Rosuvastatin Calcium Other (See Comments)  . Asa [Aspirin] Swelling  . Latanoprost Swelling    Patient Measurements: Height: 5' 9.5" (176.5 cm) Weight: 200 lb (90.7 kg) IBW/kg (Calculated) : 71.85 Heparin Dosing Weight: 90.1 kg  Vital Signs: Temp: 98 F (36.7 C) (12/10 1438) Temp Source: Oral (12/10 1438) BP: 163/91 (12/10 1438) Pulse Rate: 61 (12/10 1529)  Labs: Recent Labs    08/25/19 1450  HGB 14.2  HCT 42.2  PLT 193  CREATININE 1.03  TROPONINIHS 4    Estimated Creatinine Clearance: 77.1 mL/min (by C-G formula based on SCr of 1.03 mg/dL).   Medical History: Past Medical History:  Diagnosis Date  . 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 Diabetes mellitus without complication ([place])   . Diverticulosis   . Essential hypertension   . GERD (gastroesophageal reflux disease)   . Glaucoma    Bilateral  . Headache   . History of colonoscopy   . OSA on CPAP   . Psoriasis     Medications:  Scheduled:  . heparin  6,000 Units Intravenous Once    Assessment: Patient is a 39 yom that presented to the ED with after being informed that he has  Several small nonobstructive PE's. Pharmacy has been asked to dose heparin at this time    Goal of Therapy:  Heparin level 0.3-0.7 units/ml Monitor platelets by anticoagulation protocol: Yes   Plan:  - Heparin bolus of 6000 units IV x 1 dose  - Heparin drip @ 1550 units/hr  - Heparin level in 6 hours  - Monitor for s/s of bleeding and CBC daily while on heparin   Duanne Limerick PharmD. BCPS  08/25/2019,4:03 PM

## 2019-08-25 NOTE — ED Triage Notes (Signed)
Pt reports having a scan done this AM for Dr. Radford Pax and sent here for PE. Pt endorses chest tightness and SOB.

## 2019-08-25 NOTE — Telephone Encounter (Signed)
[facility] called to report the pts noncardiac portion of his CT shows nonobstructive PE... will fax to med rec and give to the DOD.. Dr. Rayann Heman.

## 2019-08-26 ENCOUNTER — Observation Stay (HOSPITAL_BASED_OUTPATIENT_CLINIC_OR_DEPARTMENT_OTHER): Payer: Medicare Other

## 2019-08-26 DIAGNOSIS — I82409 Acute embolism and thrombosis of unspecified deep veins of unspecified lower extremity: Secondary | ICD-10-CM

## 2019-08-26 DIAGNOSIS — I2694 Multiple subsegmental pulmonary emboli without acute cor pulmonale: Secondary | ICD-10-CM | POA: Diagnosis not present

## 2019-08-26 DIAGNOSIS — E119 Type 2 diabetes mellitus without complications: Secondary | ICD-10-CM | POA: Diagnosis not present

## 2019-08-26 DIAGNOSIS — I2699 Other pulmonary embolism without acute cor pulmonale: Secondary | ICD-10-CM | POA: Diagnosis not present

## 2019-08-26 DIAGNOSIS — K219 Gastro-esophageal reflux disease without esophagitis: Secondary | ICD-10-CM | POA: Diagnosis not present

## 2019-08-26 DIAGNOSIS — I700 Atherosclerosis of aorta: Secondary | ICD-10-CM | POA: Diagnosis not present

## 2019-08-26 DIAGNOSIS — I10 Essential (primary) hypertension: Secondary | ICD-10-CM | POA: Diagnosis not present

## 2019-08-26 LAB — BASIC METABOLIC PANEL
Anion gap: 9 (ref 5–15)
BUN: 10 mg/dL (ref 8–23)
CO2: 25 mmol/L (ref 22–32)
Calcium: 8.6 mg/dL — ABNORMAL LOW (ref 8.9–10.3)
Chloride: 105 mmol/L (ref 98–111)
Creatinine, Ser: 1.02 mg/dL (ref 0.61–1.24)
GFR calc Af Amer: >60 mL/min (ref >60)
GFR calc non Af Amer: >60 mL/min (ref >60)
Glucose, Bld: 111 mg/dL — ABNORMAL HIGH (ref 70–99)
Potassium: 3.6 mmol/L (ref 3.5–5.1)
Sodium: 139 mmol/L (ref 135–145)

## 2019-08-26 LAB — CBC
HCT: 40.7 % (ref 39.0–52.0)
Hemoglobin: 14.1 g/dL (ref 13.0–17.0)
MCH: 29.3 pg (ref 26.0–34.0)
MCHC: 34.6 g/dL (ref 30.0–36.0)
MCV: 84.4 fL (ref 80.0–100.0)
Platelets: 172 10*3/uL (ref 150–400)
RBC: 4.82 MIL/uL (ref 4.22–5.81)
RDW: 13 % (ref 11.5–15.5)
WBC: 5.8 10*3/uL (ref 4.0–10.5)
nRBC: 0 % (ref 0.0–0.2)

## 2019-08-26 LAB — GLUCOSE, CAPILLARY
Glucose-Capillary: 115 mg/dL — ABNORMAL HIGH (ref 70–99)
Glucose-Capillary: 96 mg/dL (ref 70–99)

## 2019-08-26 LAB — HEPARIN LEVEL (UNFRACTIONATED)
Heparin Unfractionated: 1.06 IU/mL — ABNORMAL HIGH (ref 0.30–0.70)
Heparin Unfractionated: 1.12 IU/mL — ABNORMAL HIGH (ref 0.30–0.70)

## 2019-08-26 MED ORDER — RIVAROXABAN 15 MG PO TABS
15.0000 mg | ORAL_TABLET | Freq: Two times a day (BID) | ORAL | Status: DC
  Administered 2019-08-26: 15 mg via ORAL
  Filled 2019-08-26: qty 1

## 2019-08-26 MED ORDER — PRAVASTATIN SODIUM 10 MG PO TABS: 20.0000 mg | ORAL_TABLET | Freq: Every day | ORAL | Status: DC

## 2019-08-26 MED ORDER — RIVAROXABAN (XARELTO) VTE STARTER PACK (15 & 20 MG): ORAL_TABLET | ORAL | 0 refills | Status: DC

## 2019-08-26 MED ORDER — RIVAROXABAN 20 MG PO TABS: 20.0000 mg | ORAL_TABLET | Freq: Every day | ORAL | Status: DC

## 2019-08-26 MED FILL — XARELTO STARTER PACK: 15 & 20 | 30 days supply | Qty: 51 | Fill #0

## 2019-08-26 NOTE — Progress Notes (Signed)
Venous duplex lower ext  has been completed. Refer to [facility] under chart review to view preliminary results.   08/26/2019  12:06 PM Deannie Resetar, Bonnye Fava

## 2019-08-26 NOTE — Care Management (Signed)
  Medication/Dose: Alveda Reasons  20 MG DAILY   Tier: 3 Drug  Prescription Coverage Preferred Pharmacy: [facility] [place] with Person/Company/Phone Number:: JASMIN @ [employer] # [phone]  Co-Pay: $ 232.00  Q/L  ONE PILL PER DAY  Prior Approval: No  Deductible: Unmet   Discussed with patient. He wants to stay on the [place]

## 2019-08-26 NOTE — Care Management CC44 (Signed)
Condition Code 44 Documentation Completed  Patient Details  Name: Corey Gill MRN: [id number] Date of Birth: 04/06/1951   Condition Code 44 given:  Yes Patient signature on Condition Code 44 notice:  Yes Documentation of 2 MD's agreement:  Yes Code 44 added to claim:  Yes    Marilu Favre, RN 08/26/2019, 10:37 AM

## 2019-08-26 NOTE — Discharge Summary (Signed)
Physician Discharge Summary  Keidrick Plack [id number] DOB: 27-Oct-1950 DOA: 08/25/2019  PCP: Hulan Fess, MD  Admit date: 08/25/2019 Discharge date: 08/26/2019  Time spent: 45 minutes  Recommendations for Outpatient Follow-up:  1. Unprovoked pulmonary embolism started on Xarelto, continue this for at least 3 to 6 months 2. Chronic cough, referral sent to [facility] 3. Cardiology Dr. Radford Pax in 1 month   Discharge Diagnoses:  Principal Problem:   Pulmonary embolism ([place]) Active Problems:   HLD (hyperlipidemia)   OSA on CPAP   Obesity (BMI 30-39.9)   Essential hypertension   Diabetes mellitus without complication ([place])   GERD (gastroesophageal reflux disease) History of CVA  Discharge Condition: Stable  Diet recommendation: Diabetic  Filed Weights   08/25/19 1439  Weight: 90.7 kg    History of present illness:  Corey Gill is a 68 y.o. male with medical history significant of hypertension, diabetes, obesity, obstructive sleep apnea on CPAP, GERD presents to emergency department due to chest tightness and shortness of breath since couple of months.  Patient tells me that he had regular follow-up appointment with his cardiologist Dr. Radford Pax and had a CT performed this morning which came back positive for pulmonary embolism and he was called and was asked to come to the emergency department for further evaluation and management.  Reports that his shortness of breath is worsening, associated with dry hacking cough   Hospital Course:   Acute pulmonary embolism: -Noted to have left upper lobe PE on coronary CT arranged by cardiology looking for CAD -Sent to the hospital yesterday for admission, treated with IV heparin initially subsequently transitioned to oral Xarelto this morning -Ambulated in the halls with staff, no symptoms noted at this time -Trigger for this is unclear, patient did go on a drive last month, which was 3 hours drive one way, potentially  could have contributed, he is up-to-date on age-appropriate malignancy screening, no history of Covid infection or exposure -Recommend continue anticoagulation for at least 3 to 6 months  Nonobstructive CAD -Follow-up with Dr. Radford Pax, LDL was up, increase pravastatin dose per Dr. Theodosia Blender recommendation  Hypertension: Well-controlled -Continue amlodipine, lisinopril   Diabetes mellitus:  -Resume glimepiride  Chronic cough -Patient reports this is been ongoing since he had a bad respiratory infection in December 2019 -CT chest only remarkable for left upper lobe PE, which is unlikely to have caused his chronic cough, GERD is a possibility however he reports that his symptoms are controlled on daily PPI,, lisinopril was also considered however he has been on this for 4 to 5 years and his cough only started a year ago, hence this was not discontinued -Recommended follow-up with pulmonary  GERD: Stable  -Continue PPI  Obstructive sleep apnea: Stable -Continue CPAP at bedtime.  Discharge Exam: Vitals:   08/26/19 0523 08/26/19 0936  BP: (!) 141/71 (!) 141/75  Pulse: 63 65  Resp: 15   Temp: 97.7 F (36.5 C)   SpO2: 96%     General: AAOx3 Cardiovascular: S1-S2, regular rate rhythm Respiratory: Clear  Discharge Instructions   Discharge Instructions    Ambulatory referral to Pulmonology   Complete by: As directed    For chronic cough   Diet Carb Modified   Complete by: As directed    Increase activity slowly   Complete by: As directed      Allergies as of 08/26/2019      Reactions   Rosuvastatin Other (See Comments)   Tired and severe muscle aches   Rosuvastatin Calcium  Other (See Comments)   Asa [aspirin] Swelling   Latanoprost Swelling   Mushroom Extract Complex Swelling      Medication List    TAKE these medications   amLODipine 5 MG tablet Commonly known as: NORVASC Take 1 tablet (5 mg total) by mouth daily.   dorzolamide 2 % ophthalmic  solution Commonly known as: TRUSOPT Place 1 drop into both eyes 2 (two) times daily.   glimepiride 1 MG tablet Commonly known as: AMARYL Take 1 mg by mouth daily.   halobetasol 0.05 % ointment Commonly known as: ULTRAVATE Apply 1 application topically daily.   lisinopril 40 MG tablet Commonly known as: ZESTRIL Take 40 mg by mouth daily.   mometasone 0.1 % ointment Commonly known as: ELOCON Apply 1 application topically daily.   omeprazole 20 MG capsule Commonly known as: PRILOSEC Take 20 mg by mouth daily.   pravastatin 10 MG tablet Commonly known as: PRAVACHOL Take 2 tablets (20 mg total) by mouth at bedtime. What changed: how much to take   Rivaroxaban 15 & 20 MG Tbpk Follow package directions: Take one 15mg  tablet by mouth twice a day. On day 22, switch to one 20mg  tablet once a day. Take with food.   timolol 0.5 % ophthalmic solution Commonly known as: BETIMOL Place 2 drops into both eyes daily.      Allergies  Allergen Reactions  . Rosuvastatin Other (See Comments)    Tired and severe muscle aches  . Rosuvastatin Calcium Other (See Comments)  . Asa [Aspirin] Swelling  . Latanoprost Swelling  . Mushroom Extract Complex Swelling   Follow-up Information    Little, Lennette Bihari, MD. Schedule an appointment as soon as possible for a visit in 1 week(s).   Specialty: Family Medicine Contact information: [place] [address] [phone]        Sueanne Margarita, MD. Schedule an appointment as soon as possible for a visit in 1 month(s).   Specialty: Cardiology Contact information: [id number] [address] [phone]        [facility] Follow up in 1 month(s).   Why: Office will call you with appointment for chronic cough           The results of significant diagnostics from this hospitalization (including imaging, microbiology, ancillary and laboratory) are listed below for reference.    Significant  Diagnostic Studies: CT CORONARY MORPH W/CTA COR W/SCORE W/CA W/CM &/OR WO/CM  Addendum Date: 08/25/2019   ADDENDUM REPORT: 08/25/2019 12:32 EXAM: Cardiac/Coronary  CT TECHNIQUE: The patient was scanned on a [employer]. FINDINGS: A 120 kV prospective scan was triggered in the descending thoracic aorta at 111 HU's. Axial non-contrast 3 mm slices were carried out through the heart. The data set was analyzed on a dedicated work station and scored using the [place]. Gantry rotation speed was 250 msecs and collimation was .6 mm. No beta blockade and 0.8 mg of sl NTG was given. The 3D data set was reconstructed in 5% intervals of the 67-82 % of the R-R cycle. Diastolic phases were analyzed on a dedicated work station using MPR, MIP and VRT modes. The patient received 80 cc of contrast. Aorta: Normal size. Calcifications of the aortic root. No dissection. Aortic Valve:  Trileaflet.  No calcifications. Coronary Arteries:  Normal coronary origin.  Right dominance. RCA is a large dominant artery that gives rise to PDA and PLVB. There is minimal calcified plaque in the ostium of the  RCA with associated stenosis of 0-24%. Left main is a large artery that gives rise to LAD, Ramus and LCX arteries. There is minimal calcified plaque in the proximal LM with associated stenosis of 0-24%. There is mild calcified plaque in the distal LM with associated stenosis of 25-49%. LAD is a large vessel that gives rise to a small D1 and D2 branches. There is mild calcified plaque in the ostial LAD with associated stenosis of 25-49%. There is minimal mixed plaque in the proximal LAD with associated stenosis of 0-24%. Ramus is a moderate sized vessel.  There is no plaque. LCX is a non-dominant artery.  There is no plaque. Other findings: Normal pulmonary vein drainage into the left atrium. Normal let atrial appendage without a thrombus. Normal size of the pulmonary artery. IMPRESSION: 1. Coronary calcium score of 173. This  was 83rd percentile for age and sex matched control. 2. Normal coronary origin with right dominance. 3. There is mild calcified plaque in the distal LM and ostial LAD. CAD-RADS 1. 4. The study has been sent for [facility] analysis. 5. Recommend aggressive risk factor modification with guideline directed medical therapy. 6. Consider non cardiac causes of chest pain. Fransico Him Electronically Signed   By: Fransico Him   On: 08/25/2019 12:32   Result Date: 08/25/2019 EXAM: OVER-READ INTERPRETATION  CT CHEST The following report is an over-read performed by radiologist Dr. Vinnie Langton of [facility], [place] on 08/25/2019. This over-read does not include interpretation of cardiac or coronary anatomy or pathology. The coronary calcium score/coronary CTA interpretation by the cardiologist is attached. COMPARISON:  None. FINDINGS: There are filling defects within segmental sized and subsegmental sized pulmonary artery branches to the left upper lobe, compatible with acute nonobstructive embolus. Aortic atherosclerosis. Within the visualized portions of the thorax there are no suspicious appearing pulmonary nodules or masses, there is no acute consolidative airspace disease, no pleural effusions, no pneumothorax and no lymphadenopathy. Visualized portions of the upper abdomen are unremarkable. There are no aggressive appearing lytic or blastic lesions noted in the visualized portions of the skeleton. IMPRESSION: 1. Multiple nonobstructive pulmonary emboli in the segmental and subsegmental sized pulmonary artery branches of the left upper lobe. 2. Aortic Atherosclerosis (ICD10-I70.0). These results will be called to the ordering clinician or representative by the Radiologist Assistant, and communication documented in the PACS or zVision Dashboard. Electronically Signed: By: Vinnie Langton M.D. On: 08/25/2019 11:39   CT CORONARY FRACTIONAL FLOW RESERVE DATA PREP  Result Date: 08/26/2019 EXAM: FFRCT ANALYSIS  FINDINGS: FFRct analysis was performed on the original cardiac CT angiogram dataset. Diagrammatic representation of the FFRct analysis is provided in a separate PDF document in PACS. This dictation was created using the PDF document and an interactive 3D model of the results. 3D model is not available in the EMR/PACS. Normal FFR range is >0.80. 1. Left Main: No significant stenosis. FFR = 0.97. 2. LAD: No significant stenosis. Proximal FFR = 0.93, Mid FFR = 0.91, Distal FFR = 0.84. 3. LCX: No significant stenosis. Proximal FFR = 0.96, Mid to Distal FFR = 0.93. 4. Ramus: No signifcant stenosis. Proximal FFR = 0.98, Mid FFR = 0.95, Distal FFR = 0.89. 5. RCA: No significant stenosis. Proximal FFR = 0.99, Mid FFR = 0.96, Distal FFR = 0.86. IMPRESSION: 1.  CT FFR analysis showed no significant flow limiting lesions. Fransico Him Electronically Signed   By: Fransico Him   On: 08/26/2019 10:20   VAS [place] LOWER EXTREMITY VENOUS (DVT)  Result  Date: 08/26/2019  Lower Venous Study Indications: Pulmonary embolism.  Comparison Study: No priors. Performing Technologist: Oda Cogan RDMS, RVT  Examination Guidelines: A complete evaluation includes B-mode imaging, spectral Doppler, color Doppler, and power Doppler as needed of all accessible portions of each vessel. Bilateral testing is considered an integral part of a complete examination. Limited examinations for reoccurring indications may be performed as noted.  +---------+---------------+---------+-----------+----------+--------------+ RIGHT    CompressibilityPhasicitySpontaneityPropertiesThrombus Aging +---------+---------------+---------+-----------+----------+--------------+ CFV      Full           Yes      Yes                                 +---------+---------------+---------+-----------+----------+--------------+ SFJ      Full                                                         +---------+---------------+---------+-----------+----------+--------------+ FV Prox  Full                                                        +---------+---------------+---------+-----------+----------+--------------+ FV Mid   Full                                                        +---------+---------------+---------+-----------+----------+--------------+ FV DistalFull                                                        +---------+---------------+---------+-----------+----------+--------------+ PFV      Full                                                        +---------+---------------+---------+-----------+----------+--------------+ POP      Full           Yes      Yes                                 +---------+---------------+---------+-----------+----------+--------------+ PTV      Full                                                        +---------+---------------+---------+-----------+----------+--------------+ PERO     Full                                                        +---------+---------------+---------+-----------+----------+--------------+   +---------+---------------+---------+-----------+----------+--------------+  LEFT     CompressibilityPhasicitySpontaneityPropertiesThrombus Aging +---------+---------------+---------+-----------+----------+--------------+ CFV      Full           Yes      Yes                                 +---------+---------------+---------+-----------+----------+--------------+ SFJ      Full                                                        +---------+---------------+---------+-----------+----------+--------------+ FV Prox  Full                                                        +---------+---------------+---------+-----------+----------+--------------+ FV Mid   Full                                                         +---------+---------------+---------+-----------+----------+--------------+ FV DistalFull                                                        +---------+---------------+---------+-----------+----------+--------------+ PFV      Full                                                        +---------+---------------+---------+-----------+----------+--------------+ POP      Full           Yes      Yes                                 +---------+---------------+---------+-----------+----------+--------------+ PTV      Full                                                        +---------+---------------+---------+-----------+----------+--------------+ PERO     Full                                                        +---------+---------------+---------+-----------+----------+--------------+     Summary: Right: There is no evidence of deep vein thrombosis in the lower extremity. No cystic structure found in the popliteal fossa. Left: There is no evidence of deep vein thrombosis in the lower extremity. No cystic structure found in the popliteal fossa.  *  See table(s) above for measurements and observations.    Preliminary    MR ORBITS W WO CONTRAST  Result Date: 08/08/2019 CLINICAL DATA:  Blurred vision. Progressive vision loss over the last 6 months. EXAM: MRI OF THE ORBITS WITHOUT AND WITH CONTRAST TECHNIQUE: Multiplanar, multisequence MR imaging of the orbits was performed both before and after the administration of intravenous contrast. CONTRAST:  37mL MULTIHANCE GADOBENATE DIMEGLUMINE 529 MG/ML IV SOLN COMPARISON:  None. FINDINGS: Globes are within normal limits bilaterally. The optic nerve is normal. Optic chiasm and tracts are normal limits. Lenses are located and within limits. Extraocular muscles are within normal limits bilaterally focal enhancement mass lesion is present. Vascular structures are normal. The pituitary is within normal limits. Pituitary stalk is midline.  Cavernous sinus is within normal limits bilaterally. Generalized atrophy and diffuse white matter disease is present bilaterally, moderately advanced for age. The ventricles are proportionate to the degree of atrophy. The craniocervical junction is normal. Upper cervical spine is within normal limits. Marrow signal is unremarkable. IMPRESSION: 1. Normal MRI appearance of the orbits bilaterally. 2. generalized atrophy and white matter disease is moderately advanced for age. This likely reflects the sequela of chronic microvascular ischemia. Electronically Signed   By: San Morelle M.D.   On: 08/08/2019 15:45    Microbiology: Recent Results (from the past 240 hour(s))  SARS CORONAVIRUS 2 (TAT 6-24 HRS) Nasopharyngeal Nasopharyngeal Swab     Status: None   Collection Time: 08/25/19  3:49 PM   Specimen: Nasopharyngeal Swab  Result Value Ref Range Status   SARS Coronavirus 2 NEGATIVE NEGATIVE Final    Comment: (NOTE) SARS-CoV-2 target nucleic acids are NOT DETECTED. The SARS-CoV-2 RNA is generally detectable in upper and lower respiratory specimens during the acute phase of infection. Negative results do not preclude SARS-CoV-2 infection, do not rule out co-infections with other pathogens, and should not be used as the sole basis for treatment or other patient management decisions. Negative results must be combined with clinical observations, patient history, and epidemiological information. The expected result is Negative. Fact Sheet for Patients: SugarRoll.be Fact Sheet for Healthcare Providers: https://www.woods-mathews.com/ This test is not yet approved or cleared by the [place] FDA and  has been authorized for detection and/or diagnosis of SARS-CoV-2 by FDA under an Emergency Use Authorization (EUA). This EUA will remain  in effect (meaning this test can be used) for the duration of the COVID-19 declaration under Section 56 4(b)(1) of  the Act, 21 U.S.C. section 360bbb-3(b)(1), unless the authorization is terminated or revoked sooner. Performed at [facility], [place] [address], [place], [place] [address]      Labs: Basic Metabolic Panel: Recent Labs  Lab 08/25/19 1450 08/26/19 0055  NA 139 139  K 4.0 3.6  CL 103 105  CO2 26 25  GLUCOSE 110* 111*  BUN 11 10  CREATININE 1.03 1.02  CALCIUM 9.0 8.6*   Liver Function Tests: No results for input(s): AST, ALT, ALKPHOS, BILITOT, PROT, ALBUMIN in the last 168 hours. No results for input(s): LIPASE, AMYLASE in the last 168 hours. No results for input(s): AMMONIA in the last 168 hours. CBC: Recent Labs  Lab 08/25/19 1450 08/26/19 0055  WBC 5.8 5.8  HGB 14.2 14.1  HCT 42.2 40.7  MCV 86.8 84.4  PLT 193 172   Cardiac Enzymes: No results for input(s): CKTOTAL, CKMB, CKMBINDEX, TROPONINI in the last 168 hours. BNP: BNP (last 3 results) Recent Labs    08/25/19 1450  BNP 17.6  ProBNP (last 3 results) No results for input(s): PROBNP in the last 8760 hours.  CBG: Recent Labs  Lab 08/25/19 1816 08/25/19 2123 08/26/19 0623 08/26/19 1111  GLUCAP 90 139* 115* 96       Signed:  Domenic Polite MD.  [facility] 08/26/2019, 12:53 PM

## 2019-08-26 NOTE — Progress Notes (Signed)
ANTICOAGULATION CONSULT NOTE - Follow Up Consult  Pharmacy Consult for heparin Indication: pulmonary embolus  Labs: Recent Labs    08/25/19 1450 08/25/19 1530 08/25/19 1700 08/26/19 0055  HGB 14.2  --   --  14.1  HCT 42.2  --   --  40.7  PLT 193  --   --  172  APTT  --  34  --   --   LABPROT  --  12.9  --   --   INR  --  1.0  --   --   HEPARINUNFRC  --   --   --  1.06*  CREATININE 1.03  --   --  1.02  TROPONINIHS 4  --  4  --     Assessment: 68yo male supratherapeutic on heparin with initial dosing for PE; no gtt issues or signs of bleeding per RN.  Goal of Therapy:  Heparin level 0.3-0.7 units/ml   Plan:  Will decrease heparin gtt by 2-3 units/kg/hr to 1300 units/hr and check level in 6 hours.    Wynona Neat, PharmD, BCPS  08/26/2019,2:11 AM

## 2019-08-26 NOTE — Progress Notes (Signed)
NURSING PROGRESS NOTE  Corey Gill [id number] Discharge Data: 08/26/2019 2:34 PM Attending Provider: Domenic Polite, MD [id number], Lennette Bihari, MD     [name] to be D/C'd Home per MD order.  Discussed with the patient the After Visit Summary and all questions fully answered. All IV's discontinued with no bleeding noted. All belongings returned to patient for patient to take home.   Last Vital Signs:  Blood pressure (!) 141/75, pulse 65, temperature 97.7 F (36.5 C), temperature source Oral, resp. rate 15, height 5' 9.5" (1.765 m), weight 90.7 kg, SpO2 96 %.  Discharge Medication List Allergies as of 08/26/2019      Reactions   Rosuvastatin Other (See Comments)   Tired and severe muscle aches   Rosuvastatin Calcium Other (See Comments)   Asa [aspirin] Swelling   Latanoprost Swelling   Mushroom Extract Complex Swelling      Medication List    TAKE these medications   amLODipine 5 MG tablet Commonly known as: NORVASC Take 1 tablet (5 mg total) by mouth daily. Notes to patient: 08/27/2019   dorzolamide 2 % ophthalmic solution Commonly known as: TRUSOPT Place 1 drop into both eyes 2 (two) times daily. Notes to patient: 08/26/2019   glimepiride 1 MG tablet Commonly known as: AMARYL Take 1 mg by mouth daily. Notes to patient: 08/26/2019   halobetasol 0.05 % ointment Commonly known as: ULTRAVATE Apply 1 application topically daily. Notes to patient: 08/27/2019   lisinopril 40 MG tablet Commonly known as: ZESTRIL Take 40 mg by mouth daily. Notes to patient: 08/27/2019   mometasone 0.1 % ointment Commonly known as: ELOCON Apply 1 application topically daily. Notes to patient: Continue as needed schedule    omeprazole 20 MG capsule Commonly known as: PRILOSEC Take 20 mg by mouth daily. Notes to patient: 08/27/2019   pravastatin 10 MG tablet Commonly known as: PRAVACHOL Take 2 tablets (20 mg total) by mouth at bedtime. What changed: how much to take Notes  to patient: 08/26/2019   Rivaroxaban 15 & 20 MG Tbpk Follow package directions: Take one 15mg  tablet by mouth twice a day. On day 22, switch to one 20mg  tablet once a day. Take with food. Notes to patient: 08/26/2019   timolol 0.5 % ophthalmic solution Commonly known as: BETIMOL Place 2 drops into both eyes daily. Notes to patient: 08/26/2019

## 2019-08-26 NOTE — Progress Notes (Signed)
Pt ambulated with RN around the whole unit. PT didn't need any device for assistance. Pt tolerated well and returned to his room.

## 2019-08-26 NOTE — TOC Benefit Eligibility Note (Signed)
Transition of Care [facility]) Benefit Eligibility Note    Patient Details  Name: Corey Gill MRN: [id number] Date of Birth: 01-19-1951   Medication/Dose: Alveda Reasons  20 MG DAILY     Tier: 3 Drug  Prescription Coverage Preferred Pharmacy: [facility] [place] with Person/Company/Phone Number:: [id number] @ [facility] # [phone]  Co-Pay: $ 232.00  Q/L  ONE PILL PER DAY  Prior Approval: No  Deductible: Unmet       Memory Argue Phone Number: 08/26/2019, 11:41 AM

## 2019-08-26 NOTE — Care Management Obs Status (Signed)
[place] NOTIFICATION   Patient Details  Name: Corey Gill MRN: [id number] Date of Birth: 06/02/1951   Medicare Observation Status Notification Given:  Yes    Marilu Favre, RN 08/26/2019, 10:37 AM

## 2019-08-26 NOTE — TOC Initial Note (Signed)
Transition of Care [facility]) - Initial/Assessment Note    Patient Details  Name: Corey Gill MRN: [id number] Date of Birth: 1951/01/25  Transition of Care [facility]) CM/SW Contact:    Marilu Favre, RN Phone Number: 08/26/2019, 10:39 AM  Clinical Narrative:                 Spoke to patient at bedside. Confirmed face sheet information. From home with wife.   Plan to start Xarelto, changed pharmacy to [facility]. [facility] will fill Xarelto prescription and bring Xarelto to patient prior to discharge.    Entered a benefits check for Xarelto 20 mg PO daily times 30 days.   Patient aware of all above and voiced understanding. Expected Discharge Plan: Home/Self Care Barriers to Discharge: Continued Medical Work up   Patient Goals and CMS Choice Patient states their goals for this hospitalization and ongoing recovery are:: to go home CMS Medicare.gov Compare Post Acute Care list provided to:: Patient    Expected Discharge Plan and Services Expected Discharge Plan: Home/Self Care   Discharge Planning Services: CM Consult   Living arrangements for the past 2 months: Single Family Home                 DME Arranged: N/A         HH Arranged: NA          Prior Living Arrangements/Services Living arrangements for the past 2 months: Single Family Home Lives with:: Spouse Patient language and need for interpreter reviewed:: Yes Do you feel safe going back to the place where you live?: Yes      Need for Family Participation in Patient Care: Yes (Comment) Care giver support system in place?: Yes (comment)   Criminal Activity/Legal Involvement Pertinent to Current Situation/Hospitalization: No - Comment as needed  Activities of Daily Living Home Assistive Devices/Equipment: None ADL Screening (condition at time of admission) Patient's cognitive ability adequate to safely complete daily activities?: Yes Is the patient deaf or have difficulty hearing?:  No Does the patient have difficulty seeing, even when wearing glasses/contacts?: Yes Does the patient have difficulty concentrating, remembering, or making decisions?: No Patient able to express need for assistance with ADLs?: Yes Does the patient have difficulty dressing or bathing?: No Independently performs ADLs?: Yes (appropriate for developmental age) Does the patient have difficulty walking or climbing stairs?: No Weakness of Legs: None Weakness of Arms/Hands: None  Permission Sought/Granted   Permission granted to share information with : No              Emotional Assessment Appearance:: Appears stated age Attitude/Demeanor/Rapport: Engaged Affect (typically observed): Adaptable Orientation: : Oriented to Self, Oriented to Place, Oriented to  Time, Oriented to Situation Alcohol / Substance Use: Not Applicable Psych Involvement: No (comment)  Admission diagnosis:  Pulmonary embolism (HCC) [I26.99] Multiple pulmonary emboli ([place]) [I26.99] Patient Active Problem List   Diagnosis Date Noted  . Pulmonary embolism ([place]) 08/25/2019  . Diabetes mellitus without complication ([place])   . GERD (gastroesophageal reflux disease)   . Bradycardia 03/01/2014  . Obesity (BMI 30-39.9) 08/29/2013  . OSA on CPAP   . Essential hypertension   . 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 Cramp of limb 03/02/2013  . 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 Family history of malignant neoplasm of prostate 03/02/2013  . Impaired fasting glucose 03/02/2013  . HLD (hyperlipidemia) 03/02/2013  . Other psoriasis 03/02/2013   PCP:  Hulan Fess, MD Pharmacy:  [facility] [place], [place] AT [facility] [address] [place] [address] Phone: [phone] Fax: [phone]  [facility] [place], [place] - [address] [place] [address] Phone:  [phone] Fax: [phone]     Social Determinants of Health (SDOH) Interventions    Readmission Risk Interventions No flowsheet data found.

## 2019-08-26 NOTE — Discharge Instructions (Signed)
Information on my medicine - XARELTO (rivaroxaban)  This medication education was reviewed with me or my healthcare representative as part of my discharge preparation.    WHY WAS XARELTO PRESCRIBED FOR YOU? Xarelto was prescribed to treat blood clots that may have been found in the veins of your legs (deep vein thrombosis) or in your lungs (pulmonary embolism) and to reduce the risk of them occurring again.  What do you need to know about Xarelto? The starting dose is one 15 mg tablet taken TWICE daily with food for the FIRST 21 DAYS then on 09/16/19  the dose is changed to one 20 mg tablet taken ONCE A DAY with your evening meal.  DO NOT stop taking Xarelto without talking to the health care provider who prescribed the medication.  Refill your prescription for 20 mg tablets before you run out.  After discharge, you should have regular check-up appointments with your healthcare provider that is prescribing your Xarelto.  In the future your dose may need to be changed if your kidney function changes by a significant amount.  What do you do if you miss a dose? If you are taking Xarelto TWICE DAILY and you miss a dose, take it as soon as you remember. You may take two 15 mg tablets (total 30 mg) at the same time then resume your regularly scheduled 15 mg twice daily the next day.  If you are taking Xarelto ONCE DAILY and you miss a dose, take it as soon as you remember on the same day then continue your regularly scheduled once daily regimen the next day. Do not take two doses of Xarelto at the same time.   Important Safety Information Xarelto is a blood thinner medicine that can cause bleeding. You should call your healthcare provider right away if you experience any of the following: ? Bleeding from an injury or your nose that does not stop. ? Unusual colored urine (red or dark brown) or unusual colored stools (red or black). ? Unusual bruising for unknown reasons. ? A serious fall or  if you hit your head (even if there is no bleeding).  Some medicines may interact with Xarelto and might increase your risk of bleeding while on Xarelto. To help avoid this, consult your healthcare provider or pharmacist prior to using any new prescription or non-prescription medications, including herbals, vitamins, non-steroidal anti-inflammatory drugs (NSAIDs) and supplements.  This website has more information on Xarelto: [email or web address]-[email or web address].[email or web address]

## 2019-08-30 ENCOUNTER — Telehealth: Payer: Self-pay | Admitting: *Deleted

## 2019-08-30 DIAGNOSIS — E785 Hyperlipidemia, unspecified: Secondary | ICD-10-CM

## 2019-08-30 MED ORDER — PRAVASTATIN SODIUM 20 MG PO TABS: 20.0000 mg | ORAL_TABLET | Freq: Every evening | ORAL | 3 refills | Status: DC

## 2019-08-30 NOTE — Telephone Encounter (Signed)
   Primary Cardiologist: Fransico Him, MD  Chart reviewed as part of pre-operative protocol coverage. Cataract extractions are recognized in guidelines as low risk surgeries that do not typically require specific preoperative testing or holding of blood thinner therapy. Unfortunately, the patient was recently hospitalized for multiple (unprovoked) PE's and was placed on Xarelto. Would recommend holding procedure until further workup can be complete. Will have appointment set to see Dr. Heron Nay and may need further workup with other specialities prior to procedure.   Pre-op team: -Please make appointment with Dr. Radford Pax for follow up and route this recommendation to the requesting party    Please call with questions.  Kathyrn Drown, NP 08/30/2019, 2:34 PM

## 2019-08-30 NOTE — Telephone Encounter (Signed)
Pt notified. He will come in for fasting lab work on January 26,2021. He has been taking 2 of the 10 mg tablets of Pravastatin. Will send prescription for 20 mg dose to [facility] at [address]. Recall placed for 6 months.

## 2019-08-30 NOTE — Telephone Encounter (Signed)
   [facility] Pre-operative Risk Assessment    Request for surgical clearance:  1. What type of surgery is being performed? CATARACT SURGERY   2. When is this surgery scheduled? TBD   3. What type of clearance is required (medical clearance vs. Pharmacy clearance to hold med vs. Both)? MEDICAL  4. Are there any medications that need to be held prior to surgery and how long?  PT IS ON XARELTO; PER CLEARANCE FORM ONLY HOLD THE  ANTICOAG X 7 DAYS (FOR LOCAL EYE BLOCK)  5. Practice name and name of physician performing surgery? [facility]; DR. Leory Plowman BOWEN   6. What is your office phone number [phone]    7.   What is your office fax number [phone]  8.   Anesthesia type (None, local, MAC, general) ? TOPICAL WITH MILD SEDATION   Julaine Hua 08/30/2019, 2:18 PM  _________________________________________________________________   (provider comments below)

## 2019-08-30 NOTE — Telephone Encounter (Signed)
-----   Message from Dollene Primrose, RN sent at 08/29/2019  1:24 PM EST -----  ----- Message ----- From: Sueanne Margarita, MD Sent: 08/26/2019  11:29 AM EST To: [facility] Triage  Normal blood flow in arteries of the heart.  I reviewed findings of coronary CTA with patient on the phone.  He has mild to moderate atherosclerosis but no significant blockages.  He is currently in the hospital with a blood clot in his lungs and his LDL was checked which is too high at 89.  The goal is < 70.  I have instructed him to increase pravastatin to 20mg  daily and I will arrange a fasting lipid and LFTs in 6 weeks.  Please set him up to see me in 6 months.

## 2019-08-30 NOTE — Telephone Encounter (Signed)
Pt has been is agreeable to appt for surgery clearance. Pt is scheduled to see Kathyrn Drown, NP 09/19/19 @ 10:30 am. I will forward clearance notes to NP for appt. I will also send FYI to Dr. Johna Roles office about appt is needed by our office for clearance. I will remove from the pre op call back pool.

## 2019-08-31 DIAGNOSIS — L400 Psoriasis vulgaris: Secondary | ICD-10-CM | POA: Diagnosis not present
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
## 2019-09-19 ENCOUNTER — Ambulatory Visit: Payer: Medicare Other | Admitting: Cardiology

## 2019-09-19 ENCOUNTER — Encounter: Payer: Self-pay | Admitting: Cardiology

## 2019-09-19 ENCOUNTER — Other Ambulatory Visit: Payer: Self-pay

## 2019-09-19 VITALS — BP 140/72 | HR 65 | Ht 69.5 in | Wt 210.0 lb

## 2019-09-19 DIAGNOSIS — E785 Hyperlipidemia, unspecified: Secondary | ICD-10-CM

## 2019-09-19 DIAGNOSIS — I10 Essential (primary) hypertension: Secondary | ICD-10-CM

## 2019-09-19 DIAGNOSIS — G4733 Obstructive sleep apnea (adult) (pediatric): Secondary | ICD-10-CM

## 2019-09-19 DIAGNOSIS — I2694 Multiple subsegmental pulmonary emboli without acute cor pulmonale: Secondary | ICD-10-CM

## 2019-09-19 DIAGNOSIS — E119 Type 2 diabetes mellitus without complications: Secondary | ICD-10-CM

## 2019-09-19 DIAGNOSIS — Z9989 Dependence on other enabling machines and devices: Secondary | ICD-10-CM

## 2019-09-19 MED ORDER — RIVAROXABAN 20 MG PO TABS: 20.0000 mg | ORAL_TABLET | Freq: Every day | ORAL | 6 refills | Status: DC

## 2019-09-19 NOTE — Patient Instructions (Addendum)
Medication Instructions:   Your physician recommends that you continue on your current medications as directed. Please refer to the Current Medication list given to you today.  *If you need a refill on your cardiac medications before your next appointment, please call your pharmacy*  Lab Work:  None ordered today  Testing/Procedures:  None ordered today  Follow-Up: At [facility], you and your health needs are our priority.  As part of our continuing mission to provide you with exceptional heart care, we have created designated Provider Care Teams.  These Care Teams include your primary Cardiologist (physician) and Advanced Practice Providers (APPs -  Physician Assistants and Nurse Practitioners) who all work together to provide you with the care you need, when you need it.  Your next appointment:    On 11/03/19(Thursday) at 8:20AM with Fransico Him, MD   Other Instructions  You have been referred to [facility] pulmonology, their office will be contacting you to set up an appointment.

## 2019-10-03 ENCOUNTER — Institutional Professional Consult (permissible substitution): Payer: Medicare Other | Admitting: Emergency Medicine

## 2019-10-05 DIAGNOSIS — E1169 Type 2 diabetes mellitus with other specified complication: Secondary | ICD-10-CM | POA: Diagnosis not present

## 2019-10-05 DIAGNOSIS — I10 Essential (primary) hypertension: Secondary | ICD-10-CM | POA: Diagnosis not present

## 2019-10-05 DIAGNOSIS — L409 Psoriasis, unspecified: Secondary | ICD-10-CM | POA: Diagnosis not present

## 2019-10-05 DIAGNOSIS — I700 Atherosclerosis of aorta: Secondary | ICD-10-CM | POA: Diagnosis not present

## 2019-10-11 ENCOUNTER — Other Ambulatory Visit: Payer: Medicare Other

## 2019-10-11 ENCOUNTER — Other Ambulatory Visit: Payer: Self-pay

## 2019-10-11 DIAGNOSIS — E785 Hyperlipidemia, unspecified: Secondary | ICD-10-CM | POA: Diagnosis not present

## 2019-10-11 LAB — LIPID PANEL
Chol/HDL Ratio: 2.6 ratio (ref 0.0–5.0)
Cholesterol, Total: 161 mg/dL (ref 100–199)
HDL: 63 mg/dL (ref >39)
LDL Chol Calc (NIH): 78 mg/dL (ref 0–99)
Triglycerides: 114 mg/dL (ref 0–149)
VLDL Cholesterol Cal: 20 mg/dL (ref 5–40)

## 2019-10-11 LAB — HEPATIC FUNCTION PANEL
ALT: 24 IU/L (ref 0–44)
AST: 27 IU/L (ref 0–40)
Albumin: 4.6 g/dL (ref 3.8–4.8)
Alkaline Phosphatase: 62 IU/L (ref 39–117)
Bilirubin Total: 0.6 mg/dL (ref 0.0–1.2)
Bilirubin, Direct: 0.19 mg/dL (ref 0.00–0.40)
Total Protein: 6.9 g/dL (ref 6.0–8.5)

## 2019-10-12 ENCOUNTER — Encounter: Payer: Self-pay | Admitting: Emergency Medicine

## 2019-10-12 ENCOUNTER — Telehealth: Payer: Self-pay

## 2019-10-12 ENCOUNTER — Telehealth: Payer: Self-pay | Admitting: Cardiology

## 2019-10-12 ENCOUNTER — Ambulatory Visit: Payer: Medicare Other | Admitting: Emergency Medicine

## 2019-10-12 DIAGNOSIS — E785 Hyperlipidemia, unspecified: Secondary | ICD-10-CM

## 2019-10-12 DIAGNOSIS — R053 Chronic cough: Secondary | ICD-10-CM

## 2019-10-12 DIAGNOSIS — R05 Cough: Secondary | ICD-10-CM | POA: Diagnosis not present

## 2019-10-12 DIAGNOSIS — Z9989 Dependence on other enabling machines and devices: Secondary | ICD-10-CM

## 2019-10-12 DIAGNOSIS — I2694 Multiple subsegmental pulmonary emboli without acute cor pulmonale: Secondary | ICD-10-CM

## 2019-10-12 DIAGNOSIS — G4733 Obstructive sleep apnea (adult) (pediatric): Secondary | ICD-10-CM | POA: Diagnosis not present

## 2019-10-12 MED ORDER — IRBESARTAN 300 MG PO TABS: 300.0000 mg | ORAL_TABLET | Freq: Every day | ORAL | 1 refills | Status: DC

## 2019-10-12 MED ORDER — PRAVASTATIN SODIUM 40 MG PO TABS: 40.0000 mg | ORAL_TABLET | Freq: Every evening | ORAL | 3 refills | Status: DC

## 2019-10-12 NOTE — Telephone Encounter (Signed)
Patient returned call for lab results.  

## 2019-10-12 NOTE — Patient Instructions (Signed)
We will plan to continue your Xarelto for 6 months total, through May 10.  At that time we will stop the medication, check blood work to gauge your risk for another blood clot.  Depending on results and your risk factors we may decide to restart blood thinning medication. Please try stopping lisinopril Start irbesartan 300 mg once daily until next visit. We will perform pulmonary function testing at your next visit Continue omeprazole as you are taking it Follow with Dr. Lamonte Sakai next available with full pulmonary function testing on the same day.

## 2019-10-12 NOTE — Progress Notes (Signed)
Subjective:    Patient ID: Corey Gill, male    DOB: 29-Aug-1951, 69 y.o.   MRN: [id number]  HPI 69 year old former smoker (7 pack years) with a history of diabetes mellitus, hypertension on an ACE inhibitor for 5 years, obstructive sleep apnea on CPAP (Dr. Radford Pax), psoriasis, possible prior CVA that may have affected swallowing.   He was experiencing some chest discomfort in the evenings, some associated cough on f/u visit w Dr Radford Pax going back to late Summer 2020.  Prompted and underwent a coronary CTA on 08/25/2019.  This was reassuring with regard to coronary disease but did show a left upper lobe PA pulmonary embolism.  He was admitted to the hospital urgently for anticoagulation.  Chronicity of the clot was unclear, may have been preceded by extended travel - he travelled to [facility] 3 hours in Nov.  He is referred for his pulmonary embolism and also for chronic cough.  He reports that he has had coughing since a severe URI in 08/2018 associated with dyspnea. The SOB is a bit better since starting Xarelto - able to do some yard work, not quite back to baseline from last year. He still has cough, hoarseness, non-productive. Seems to be bothered by eating, may have near aspiration once a week. He has some associated L chest pain. He has rare GERD, controlled on omeprazole.   He is supposed to get a cataract surgery done, was scheduled for 10/19/19 but now postponed.   No PFT available.  CT cardiac angio 08/25/2019 reviewed by me showed multiple nonobstructive PE in the segmental and subsegmental left upper lobe PA branches.   Review of Systems  Past Medical History:  Diagnosis Date  . 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 Diabetes mellitus without complication ([place])   . Diverticulosis   . Essential hypertension   . GERD (gastroesophageal reflux disease)   . Glaucoma    Bilateral  . Headache   . History of colonoscopy   . OSA on CPAP   . Psoriasis      Family History    Problem Relation Age of Onset  . Skin cancer Mother   . GER disease Mother   . Colon polyps Mother   . Prostate cancer Father   . Parkinson's disease Father   . Angina Sister   . Prostate cancer Brother      Social History   Socioeconomic History  . Marital status: Single    Spouse name: Not on file  . Number of children: Not on file  . Years of education: 45  . Highest education level: Not on file  Occupational History  . Occupation: retired    Comment: Librarian, academic  Tobacco Use  . Smoking status: Former Smoker    Quit date: 09/15/1993    Years since quitting: 26.0  . Smokeless tobacco: Never Used  Substance and Sexual Activity  . Alcohol use: No  . Drug use: No  . Sexual activity: Not on file  Other Topics Concern  . Not on file  Social History Narrative  . Not on file   Social Determinants of Health   Financial Resource Strain:   . Difficulty of Paying Living Expenses: Not on file  Food Insecurity:   . Worried About Charity fundraiser in the Last Year: Not on file  . Ran Out of Food in the Last Year: Not on file  Transportation Needs:   . Lack of Transportation (Medical): Not on file  . Lack of Transportation (  Non-Medical): Not on file  Physical Activity:   . Days of Exercise per Week: Not on file  . Minutes of Exercise per Session: Not on file  Stress:   . Feeling of Stress : Not on file  Social Connections:   . Frequency of Communication with Friends and Family: Not on file  . Frequency of Social Gatherings with Friends and Family: Not on file  . Attends Religious Services: Not on file  . Active Member of Clubs or Organizations: Not on file  . Attends Archivist Meetings: Not on file  . Marital Status: Not on file  Intimate Partner Violence:   . Fear of Current or Ex-Partner: Not on file  . Emotionally Abused: Not on file  . Physically Abused: Not on file  . 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 No pets.   Allergies  Allergen Reactions  . Rosuvastatin Other (See Comments)    Tired and severe muscle aches  . Rosuvastatin Calcium Other (See Comments)  . Asa [Aspirin] Swelling  . Latanoprost Swelling  . Mushroom Extract Complex Swelling     Outpatient Medications Prior to Visit  Medication Sig Dispense Refill  . amLODipine (NORVASC) 5 MG tablet Take 1 tablet (5 mg total) by mouth daily. 90 tablet 3  . dorzolamide (TRUSOPT) 2 % ophthalmic solution Place 1 drop into both eyes 2 (two) times daily.   2  . glimepiride (AMARYL) 1 MG tablet Take 1 mg by mouth daily.    . mometasone (ELOCON) 0.1 % ointment Apply 1 application topically daily.    Marland Kitchen omeprazole (PRILOSEC) 20 MG capsule Take 20 mg by mouth daily.    . pravastatin (PRAVACHOL) 40 MG tablet Take 1 tablet (40 mg total) by mouth every evening. 90 tablet 3  . rivaroxaban (XARELTO) 20 MG TABS tablet Take 1 tablet (20 mg total) by mouth daily with supper. 30 tablet 6  . timolol (BETIMOL) 0.5 % ophthalmic solution Place 2 drops into both eyes daily.    Marland Kitchen lisinopril (PRINIVIL,ZESTRIL) 40 MG tablet Take 40 mg by mouth daily.    . halobetasol (ULTRAVATE) 0.05 % ointment Apply 1 application topically daily.      No facility-administered medications prior to visit.        Objective:   Physical Exam  Vitals:   10/12/19 1527  BP: 120/72  Pulse: 71  Temp: 98.7 F (37.1 C)  TempSrc: Temporal  SpO2: 97%  Weight: 205 lb (93 kg)  Height: 5\' 8"  (1.727 m)    Gen: Pleasant, well-nourished, in no distress,  normal affect  ENT: No lesions,  mouth clear,  oropharynx clear, no postnasal drip  Neck: No JVD, no stridor  Lungs: No use of accessory muscles, no crackles or wheezing on normal respiration, no wheeze on forced expiration  Cardiovascular: RRR, heart sounds normal,  no murmur or gallops, no peripheral edema  Musculoskeletal: No deformities, no cyanosis or clubbing  Neuro: alert, awake, non focal  Skin: Warm, no lesions or rash      Assessment & Plan:  Pulmonary embolism (HCC) Left lower lobe segmental and subsegmental pulmonary emboli of unclear chronicity identified on CT scan from 12/10.  Started on Eliquis.  He may have had extended travel preceding so this may be a provoked clot.  As we are uncertain as to whether this was a provoked clot and given his chest discomfort (unclear whether this relates to his PE) I think he should be treated for 6 months, then consider discontinuation and hypercoagulability evaluation.  He will have to postpone his cataract surgery until after the 6 months of treatment.  OSA on CPAP Managed on CPAP by Dr. Radford Pax with cardiology.  Good compliance.  Chronic cough On an ACE inhibitor.  He also has a history of GERD, currently treated with omeprazole once daily, little breakthrough.  Plan to stop the lisinopril, substitute irbesartan 300 mg once daily. Continue omeprazole 20mg  qd, could consider empirically increasing in the future. Given his history of tobacco use, chest discomfort/tightness, plan for pulmonary function testing  Baltazar Apo, MD, PhD 10/12/2019, 4:13 PM [facility] [phone] or if no answer [phone]

## 2019-10-12 NOTE — Telephone Encounter (Signed)
The patient has been notified of the result and verbalized understanding.  All questions (if any) were answered. Antonieta Iba, RN 10/12/2019 9:05 AM

## 2019-10-12 NOTE — Telephone Encounter (Signed)
-----   Message from Sueanne Margarita, MD sent at 10/11/2019  4:46 PM EST ----- LDL goal < 70.  Please increase pravastatin to 40mg  daily and repeat FLP and ALT In 6 weeks

## 2019-10-12 NOTE — Assessment & Plan Note (Addendum)
On an ACE inhibitor.  He also has a history of GERD, currently treated with omeprazole once daily, little breakthrough.  Plan to stop the lisinopril, substitute irbesartan 300 mg once daily. Continue omeprazole 20mg  qd, could consider empirically increasing in the future. Given his history of tobacco use, chest discomfort/tightness, plan for pulmonary function testing

## 2019-10-12 NOTE — Assessment & Plan Note (Signed)
Managed on CPAP by Dr. Radford Pax with cardiology.  Good compliance.

## 2019-10-12 NOTE — Telephone Encounter (Signed)
The patient has been notified of the result and verbalized understanding.  All questions (if any) were answered. Patient aware to increase pravastatin to 40 mg daily. Appointment for repeat labs has been made.  Antonieta Iba, RN 10/12/2019 8:46 AM

## 2019-10-12 NOTE — Assessment & Plan Note (Addendum)
Left lower lobe segmental and subsegmental pulmonary emboli of unclear chronicity identified on CT scan from 12/10.  Started on Eliquis.  He may have had extended travel preceding so this may be a provoked clot.  As we are uncertain as to whether this was a provoked clot and given his chest discomfort (unclear whether this relates to his PE) I think he should be treated for 6 months, then consider discontinuation and hypercoagulability evaluation.  He will have to postpone his cataract surgery until after the 6 months of treatment.

## 2019-10-21 DIAGNOSIS — E1169 Type 2 diabetes mellitus with other specified complication: Secondary | ICD-10-CM | POA: Diagnosis not present

## 2019-10-21 DIAGNOSIS — E538 Deficiency of other specified B group vitamins: Secondary | ICD-10-CM | POA: Diagnosis not present

## 2019-10-21 DIAGNOSIS — Z1159 Encounter for screening for other viral diseases: Secondary | ICD-10-CM | POA: Diagnosis not present

## 2019-10-21 DIAGNOSIS — I700 Atherosclerosis of aorta: Secondary | ICD-10-CM | POA: Diagnosis not present

## 2019-10-21 DIAGNOSIS — L409 Psoriasis, unspecified: Secondary | ICD-10-CM | POA: Diagnosis not present

## 2019-10-21 DIAGNOSIS — I10 Essential (primary) hypertension: Secondary | ICD-10-CM | POA: Diagnosis not present
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
## 2019-11-02 ENCOUNTER — Ambulatory Visit: Payer: Medicare Other | Admitting: Cardiology

## 2019-11-02 ENCOUNTER — Other Ambulatory Visit: Payer: Self-pay | Admitting: *Deleted

## 2019-11-02 MED ORDER — IRBESARTAN 300 MG PO TABS: 300.0000 mg | ORAL_TABLET | Freq: Every day | ORAL | 1 refills | Status: DC

## 2019-11-03 ENCOUNTER — Ambulatory Visit (INDEPENDENT_AMBULATORY_CARE_PROVIDER_SITE_OTHER): Payer: Medicare Other | Admitting: Cardiology

## 2019-11-03 ENCOUNTER — Encounter: Payer: Self-pay | Admitting: Cardiology

## 2019-11-03 VITALS — BP 131/81 | HR 71 | Ht 69.5 in | Wt 200.0 lb

## 2019-11-03 DIAGNOSIS — G4733 Obstructive sleep apnea (adult) (pediatric): Secondary | ICD-10-CM

## 2019-11-03 DIAGNOSIS — I10 Essential (primary) hypertension: Secondary | ICD-10-CM | POA: Diagnosis not present

## 2019-11-03 DIAGNOSIS — E669 Obesity, unspecified: Secondary | ICD-10-CM | POA: Diagnosis not present

## 2019-11-03 DIAGNOSIS — E7800 Pure hypercholesterolemia, unspecified: Secondary | ICD-10-CM

## 2019-11-03 DIAGNOSIS — I2510 Atherosclerotic heart disease of native coronary artery without angina pectoris: Secondary | ICD-10-CM

## 2019-11-03 DIAGNOSIS — Z9989 Dependence on other enabling machines and devices: Secondary | ICD-10-CM

## 2019-11-03 NOTE — Patient Instructions (Signed)
Medication Instructions:  Your physician recommends that you continue on your current medications as directed. Please refer to the Current Medication list given to you today.  *If you need a refill on your cardiac medications before your next appointment, please call your pharmacy*   Testing/Procedures: Your physician has recommended that you have an overnight pulse oximetry to assess for nocturnal hypoxemia.   Follow-Up: At [facility], you and your health needs are our priority.  As part of our continuing mission to provide you with exceptional heart care, we have created designated Provider Care Teams.  These Care Teams include your primary Cardiologist (physician) and Advanced Practice Providers (APPs -  Physician Assistants and Nurse Practitioners) who all work together to provide you with the care you need, when you need it.  Your next appointment:   1 year(s)  The format for your next appointment:   Either In Person or Virtual  Provider:   Fransico Him, MD

## 2019-11-14 ENCOUNTER — Other Ambulatory Visit: Payer: Self-pay | Admitting: *Deleted

## 2019-11-14 MED ORDER — IRBESARTAN 300 MG PO TABS: 300.0000 mg | ORAL_TABLET | Freq: Every day | ORAL | 0 refills | Status: DC

## 2019-11-22 ENCOUNTER — Other Ambulatory Visit: Payer: Medicare Other

## 2019-11-22 ENCOUNTER — Other Ambulatory Visit: Payer: Self-pay

## 2019-11-22 ENCOUNTER — Telehealth: Payer: Self-pay | Admitting: Cardiology

## 2019-11-22 DIAGNOSIS — E785 Hyperlipidemia, unspecified: Secondary | ICD-10-CM | POA: Diagnosis not present

## 2019-11-22 LAB — LIPID PANEL
Chol/HDL Ratio: 2.6 ratio (ref 0.0–5.0)
Cholesterol, Total: 151 mg/dL (ref 100–199)
HDL: 58 mg/dL (ref >39)
LDL Chol Calc (NIH): 74 mg/dL (ref 0–99)
Triglycerides: 103 mg/dL (ref 0–149)
VLDL Cholesterol Cal: 19 mg/dL (ref 5–40)

## 2019-11-22 LAB — ALT: ALT: 19 IU/L (ref 0–44)

## 2019-11-22 NOTE — Telephone Encounter (Signed)
Pt. Came in today for blood work. He said that someone would be calling him about an oxygen monitor to monitor him while he sleeps. He says that he has not heard anything from anyone. He would like a call back to check the status.

## 2019-11-22 NOTE — Telephone Encounter (Signed)
I spoke with the patient and advised him that I have faxed the orders over to [employer] who he uses for DME, so he should be hearing something soon.

## 2019-12-02 ENCOUNTER — Other Ambulatory Visit (HOSPITAL_COMMUNITY): Payer: Medicare Other

## 2019-12-08 ENCOUNTER — Other Ambulatory Visit (HOSPITAL_COMMUNITY)
Admission: RE | Admit: 2019-12-08 | Discharge: 2019-12-08 | Disposition: A | Discharge status: Home / Self Care | Payer: Medicare Other | Source: Ambulatory Visit | Attending: Emergency Medicine | Admitting: Emergency Medicine

## 2019-12-08 DIAGNOSIS — Z20822 Contact with and (suspected) exposure to covid-19: Secondary | ICD-10-CM | POA: Insufficient documentation

## 2019-12-08 DIAGNOSIS — Z01812 Encounter for preprocedural laboratory examination: Secondary | ICD-10-CM | POA: Insufficient documentation

## 2019-12-08 LAB — SARS CORONAVIRUS 2 (TAT 6-24 HRS): SARS Coronavirus 2: NEGATIVE

## 2019-12-12 ENCOUNTER — Ambulatory Visit (INDEPENDENT_AMBULATORY_CARE_PROVIDER_SITE_OTHER): Payer: Medicare Other

## 2019-12-12 ENCOUNTER — Ambulatory Visit: Payer: Medicare Other | Admitting: Pulmonary Disease

## 2019-12-12 ENCOUNTER — Encounter: Payer: Self-pay | Admitting: Pulmonary Disease

## 2019-12-12 ENCOUNTER — Ambulatory Visit (INDEPENDENT_AMBULATORY_CARE_PROVIDER_SITE_OTHER): Payer: Medicare Other | Admitting: Emergency Medicine

## 2019-12-12 ENCOUNTER — Other Ambulatory Visit: Payer: Self-pay

## 2019-12-12 VITALS — BP 128/82 | HR 75 | Temp 97.9°F | Ht 70.0 in | Wt 207.0 lb

## 2019-12-12 DIAGNOSIS — R1314 Dysphagia, pharyngoesophageal phase: Secondary | ICD-10-CM | POA: Diagnosis not present

## 2019-12-12 DIAGNOSIS — Z9989 Dependence on other enabling machines and devices: Secondary | ICD-10-CM

## 2019-12-12 DIAGNOSIS — G4733 Obstructive sleep apnea (adult) (pediatric): Secondary | ICD-10-CM

## 2019-12-12 DIAGNOSIS — R05 Cough: Secondary | ICD-10-CM

## 2019-12-12 DIAGNOSIS — I2694 Multiple subsegmental pulmonary emboli without acute cor pulmonale: Secondary | ICD-10-CM | POA: Diagnosis not present

## 2019-12-12 DIAGNOSIS — R053 Chronic cough: Secondary | ICD-10-CM

## 2019-12-12 DIAGNOSIS — I8290 Acute embolism and thrombosis of unspecified vein: Secondary | ICD-10-CM | POA: Diagnosis not present

## 2019-12-12 DIAGNOSIS — R1310 Dysphagia, unspecified: Secondary | ICD-10-CM | POA: Insufficient documentation

## 2019-12-12 LAB — PULMONARY FUNCTION TEST
DL/VA % pred: 102 %
DL/VA: 4.19 ml/min/mmHg/L
DLCO cor % pred: 91 %
DLCO cor: 24 ml/min/mmHg
DLCO unc % pred: 91 %
DLCO unc: 24 ml/min/mmHg
FEF 25-75 Post: 2.83 L/sec
FEF 25-75 Pre: 2.21 L/sec
FEF2575-%Change-Post: 28 %
FEF2575-%Pred-Post: 110 %
FEF2575-%Pred-Pre: 85 %
FEV1-%Change-Post: 5 %
FEV1-%Pred-Post: 90 %
FEV1-%Pred-Pre: 86 %
FEV1-Post: 3.03 L
FEV1-Pre: 2.87 L
FEV1FVC-%Change-Post: 0 %
FEV1FVC-%Pred-Pre: 101 %
FEV6-%Change-Post: 5 %
FEV6-%Pred-Post: 92 %
FEV6-%Pred-Pre: 88 %
FEV6-Post: 3.95 L
FEV6-Pre: 3.76 L
FEV6FVC-%Change-Post: 0 %
FEV6FVC-%Pred-Post: 103 %
FEV6FVC-%Pred-Pre: 103 %
FVC-%Change-Post: 5 %
FVC-%Pred-Post: 89 %
FVC-%Pred-Pre: 85 %
FVC-Post: 4.03 L
FVC-Pre: 3.83 L
Post FEV1/FVC ratio: 75 %
Post FEV6/FVC ratio: 98 %
Pre FEV1/FVC ratio: 75 %
Pre FEV6/FVC Ratio: 98 %
RV % pred: 96 %
RV: 2.31 L
TLC % pred: 86 %
TLC: 6.05 L

## 2019-12-12 NOTE — Assessment & Plan Note (Signed)
Concerned of her chronic cough as well as worsening cough when swallowing or drinking liquids  Plan: Recommended patient to follow back up with neurology who performed a swallow study on the patient in 2018 Referral placed to neurology for further evaluation

## 2019-12-12 NOTE — Patient Instructions (Addendum)
You were seen today by Lauraine Rinne, NP  for:   It was nice seeing you in office today.  We reviewed your pulmonary function testing.  I am concerned about your cough as well as your swallowing.  Please reestablish with neurology Dr. Carles Collet to further evaluate your swallowing.  They ordered a swallow study in 2018 that showed that you are at risk of having aspiration.  We will get a chest x-ray today to ensure you do not have an aspiration pneumonia.  We will continue you on Xarelto.  I will refer you to hematology to further investigate why you may have developed these clots in your lungs in the first place.  Take care and stay safe, next Corey Gill  1. Multiple subsegmental pulmonary emboli without acute cor pulmonale (HCC)  - Ambulatory referral to Hematology  Continue Xarelto at this time  Based off Dr. Agustina Caroli evaluation in January/2021 you will need to wait 6 months from treatment with Xarelto before scheduling a cataract surgery  2. VTE (venous thromboembolism)  We will refer you to hematology to further evaluate your December/2020 pulmonary emboli  3. Chronic cough  - DG Chest 2 View; Future  Glad to hear the cough has somewhat improved since stopping lisinopril  I do believe that your swallowing is also contributing to your chronic cough  We will get a chest x-ray today to further evaluate  4. Pharyngoesophageal dysphagia  - Ambulatory referral to Neurology  Please present back to neurology for further evaluation.  They did a swallow study on you in 2018 which showed that you did have difficulty swallowing.  It also recommended that you use thickened liquids.  This is likely contributing to your chronic cough   5. OSA on CPAP  Continue CPAP use managed by cardiology  Keep follow-up with cardiology   We recommend today:  Orders Placed This Encounter  Procedures  . DG Chest 2 View    Standing Status:   Future    Standing Expiration Date:   02/10/2021    Order Specific  Question:   Reason for Exam (SYMPTOM  OR DIAGNOSIS REQUIRED)    Answer:   cough    Order Specific Question:   Preferred imaging location?    Answer:   Internal    Order Specific Question:   Radiology Contrast Protocol - do NOT remove file path    Answer:   [email or web address]  . Ambulatory referral to Hematology    Referral Priority:   Routine    Referral Type:   Consultation    Referral Reason:   Specialty Services Required    Requested Specialty:   Oncology    Number of Visits Requested:   1  . Ambulatory referral to Neurology    Referral Priority:   Routine    Referral Type:   Consultation    Referral Reason:   Specialty Services Required    Requested Specialty:   Neurology    Number of Visits Requested:   1   Orders Placed This Encounter  Procedures  . DG Chest 2 View  . Ambulatory referral to Hematology  . Ambulatory referral to Neurology   No orders of the defined types were placed in this encounter.   Follow Up:    Return in about 3 months (around 03/13/2020), or if symptoms worsen or fail to improve, for Follow up with Dr. Lamonte Sakai.   Please do your part to reduce the spread of COVID-19:      Reduce your  risk of any infection  and COVID19 by using the similar precautions used for avoiding the common cold or flu:  Marland Kitchen Wash your hands often with soap and warm water for at least 20 seconds.  If soap and water are not readily available, use an alcohol-based hand sanitizer with at least 60% alcohol.  . If coughing or sneezing, cover your mouth and nose by coughing or sneezing into the elbow areas of your shirt or coat, into a tissue or into your sleeve (not your hands). Langley Gauss A MASK when in public  . Avoid shaking hands with others and consider head nods or verbal greetings only. . Avoid touching your eyes, nose, or mouth with unwashed hands.  . Avoid close contact with people who are sick. . Avoid places or events with large numbers  of people in one location, like concerts or sporting events. . If you have some symptoms but not all symptoms, continue to monitor at home and seek medical attention if your symptoms worsen. . If you are having a medical emergency, call 911.   [place] / e-Visit: [email or web address]         [facility]: [facility]: [id number]                   [facility]: [id number]     It is flu season:   >>> Best ways to protect herself from the flu: Receive the yearly flu vaccine, practice good hand hygiene washing with soap and also using hand sanitizer when available, eat a nutritious meals, get adequate rest, hydrate appropriately   Please contact the office if your symptoms worsen or you have concerns that you are not improving.   Thank you for choosing [facility] for your healthcare, and for allowing [place] to partner with you on your healthcare journey. I am thankful to be able to provide care to you today.   [name] FNP-C

## 2019-12-12 NOTE — Progress Notes (Signed)
@Patient  ID: Corey Gill, male    DOB: 05-Dec-1950, 69 y.o.   MRN: [id number]  Chief Complaint  Patient presents with  . Follow-up    F/U after PFT. States his cough has improved slightly since switching from lisinopril.     Referring provider: Hulan Fess, MD  HPI:  68 year old male former smoker followed in our office for history of PE, and dyspnea on exertion  Past medical history: Obstructive sleep apnea (managed by cardiology), hyperlipidemia, obesity, bradycardia, type 2 diabetes, GERD Smoking history: Former smoker.  Quit 1995.  8.5-pack-year smoking history Maintenance: none  Patient of Dr. Lamonte Sakai  12/12/2019  - Visit   69 year old male former smoker initially seen in our office on 10/12/2019 by Dr. Lamonte Sakai at last office visit in January/2021 he was encouraged to remain on Eliquis for total of 6 months.  Patient had a left lower lobe subsegmental and segmental pulmonary emboli on CT scan on 08/24/2020.  This means patient will likely have to postpone his cataract surgery for 6 months.  He was encouraged to remain on CPAP therapy as managed by cardiology.  We will have patient stop lisinopril and substituted with irbesartan 300 mg once daily.  And a pulmonary function test was ordered.  2/29/2021-pulmonary function test-FVC 3.83 (85% predicted), postbronchodilator ratio 75, postbronchodilator FEV1 3.03 (90% predicted), no bronchodilator response, DLCO 24 (91% predicted)  Patient reported he is continuing to have a cough.  Although it has improved since stopping lisinopril.  Patient specifically reporting that his cough is worse when eating or drinking.  He sometimes struggles with swallowing liquids.  Per chart review patient had a swallow study done in 2018 by neurology.  This showed the patient was a mild aspiration risk.  The recommendations from the swallow study are listed below:  Dysphagia 3 (Mech soft) solids;Nectar thick liquid;Thin liquid   Liquid Administration via  Straw  Medication Administration Whole meds with puree  Compensations Slow rate;Small sips/bites  Postural Changes Remain semi-upright after after feeds/meals (Comment);Seated upright at 90 degrees   Patient reporting that he is drinking through a straw.  He is not using any thickener to get obtain nectar thick liquid.  He has not followed up with neurology regarding these results.  Patient has also been seen by neurology at [facility].  Patient is eager to get his cataract surgery scheduled.  He is not clear on how long he will have to wait on this.  We will review this today.  Questionaires / Pulmonary Flowsheets:   MMRC: mMRC Dyspnea Scale mMRC Score  10/12/2019 1    Tests:   12/08/2019-SARS-CoV-2-negative  08/25/2019-CT scan of chest-multiple nonobstructive pulmonary emboli in the segmental and subsegmental size pulmonary artery branches of the left upper lobe, aortic arthrosclerosis  06/30/2019-echocardiogram-LV ejection fraction 60 to 65%, global right ventricle is normal systolic function  FENO:  No results found for: NITRICOXIDE  PFT: PFT Results Latest Ref Rng & Units 12/12/2019  FVC-Pre L 3.83  FVC-Predicted Pre % 85  FVC-Post L 4.03  FVC-Predicted Post % 89  Pre FEV1/FVC % % 75  Post FEV1/FCV % % 75  FEV1-Pre L 2.87  FEV1-Predicted Pre % 86  FEV1-Post L 3.03  DLCO UNC% % 91  DLCO COR %Predicted % 102  TLC L 6.05  TLC % Predicted % 86  RV % Predicted % 96    WALK:  No flowsheet data found.  Imaging: No results found.  Lab Results:  CBC    Component Value Date/Time  WBC 5.8 08/26/2019 0055   RBC 4.82 08/26/2019 0055   HGB 14.1 08/26/2019 0055   HCT 40.7 08/26/2019 0055   PLT 172 08/26/2019 0055   MCV 84.4 08/26/2019 0055   MCH 29.3 08/26/2019 0055   MCHC 34.6 08/26/2019 0055   RDW 13.0 08/26/2019 0055    BMET    Component Value Date/Time   NA 139 08/26/2019 0055   NA 138 08/16/2019 0955   K 3.6 08/26/2019 0055   CL 105 08/26/2019 0055    CO2 25 08/26/2019 0055   GLUCOSE 111 (H) 08/26/2019 0055   BUN 10 08/26/2019 0055   BUN 12 08/16/2019 0955   CREATININE 1.02 08/26/2019 0055   CALCIUM 8.6 (L) 08/26/2019 0055   GFRNONAA >60 08/26/2019 0055   GFRAA >60 08/26/2019 0055    BNP    Component Value Date/Time   BNP 17.6 08/25/2019 1450    ProBNP No results found for: PROBNP  Specialty Problems      Pulmonary Problems   OSA on CPAP    severe with AHI on AHI 67/hr now on CPAP at 7cm H2O      Chronic cough      Allergies  Allergen Reactions  . Rosuvastatin Other (See Comments)    Tired and severe muscle aches  . Rosuvastatin Calcium Other (See Comments)  . Asa [Aspirin] Swelling  . Latanoprost Swelling  . Mushroom Extract Complex Swelling     There is no immunization history on file for this patient.  Past Medical History:  Diagnosis Date  . 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 CAD (coronary artery disease), native coronary artery    minimal CAD in RCA 0-24%, 25-49% distal LM and 25-49% in the ostial LAD by coronary CTA with normal FFR  . Diabetes mellitus without complication ([place])   . Diverticulosis   . Essential hypertension   . GERD (gastroesophageal reflux disease)   . Glaucoma    Bilateral  . Headache   . History of colonoscopy   . HLD (hyperlipidemia)   . OSA on CPAP   . Psoriasis     Tobacco History: Social History   Tobacco Use  Smoking Status Former Smoker  . Packs/day: 0.50  . Years: 17.00  . Pack years: 8.50  . Start date: 10/13/1976  . Quit date: 09/15/1993  . Years since quitting: 26.2  Smokeless Tobacco Never Used   Counseling given: Yes   Continue to not smoke  Outpatient Encounter Medications as of 12/12/2019  Medication Sig  . amLODipine (NORVASC) 5 MG tablet Take 1 tablet (5 mg total) by mouth daily.  . Cyanocobalamin (VITAMIN B-12) 1000 MCG SUBL Place under the tongue.  . dorzolamide (TRUSOPT) 2 % ophthalmic solution Place 1 drop into both eyes 2  (two) times daily.   Marland Kitchen glimepiride (AMARYL) 1 MG tablet Take 1 mg by mouth daily.  . irbesartan (AVAPRO) 300 MG tablet Take 1 tablet (300 mg total) by mouth daily. FURTHER REFILLS NEED TO COME FROM PCP  . mometasone (ELOCON) 0.1 % ointment Apply 1 application topically daily.  Marland Kitchen omeprazole (PRILOSEC) 20 MG capsule Take 20 mg by mouth daily.  . pravastatin (PRAVACHOL) 40 MG tablet Take 1 tablet (40 mg total) by mouth every evening.  . rivaroxaban (XARELTO) 20 MG TABS tablet Take 1 tablet (20 mg total) by mouth daily with supper.  . timolol (BETIMOL) 0.5 % ophthalmic solution Place 2 drops into both eyes daily.   No facility-administered encounter medications on file as of  12/12/2019.     Review of Systems  Review of Systems  Constitutional: Positive for fatigue. Negative for activity change, chills, fever and unexpected weight change.  HENT: Negative for postnasal drip, rhinorrhea, sinus pressure, sinus pain and sore throat.   Eyes: Negative.   Respiratory: Positive for cough (dry cough ). Negative for shortness of breath and wheezing.   Cardiovascular: Negative for chest pain and palpitations.  Gastrointestinal: Negative for constipation, diarrhea, nausea and vomiting.  Endocrine: Negative.   Genitourinary: Negative.   Musculoskeletal: Negative.   Skin: Negative.   Neurological: Negative for dizziness and headaches.  Psychiatric/Behavioral: Negative.  Negative for dysphoric mood. The patient is not nervous/anxious.   All other systems reviewed and are negative.    Physical Exam  BP 128/82 (BP Location: Left Arm, Patient Position: Sitting, Cuff Size: Normal)   Pulse 75   Temp 97.9 F (36.6 C) (Temporal)   Ht 5\' 10"  (1.778 m)   Wt 207 lb (93.9 kg)   SpO2 97% Comment: on RA  BMI 29.70 kg/m   Wt Readings from Last 5 Encounters:  12/12/19 207 lb (93.9 kg)  11/03/19 200 lb (90.7 kg)  10/12/19 205 lb (93 kg)  09/19/19 210 lb (95.3 kg)  08/25/19 200 lb (90.7 kg)    BMI  Readings from Last 5 Encounters:  12/12/19 29.70 kg/m  11/03/19 29.11 kg/m  10/12/19 31.17 kg/m  09/19/19 30.57 kg/m  08/25/19 29.11 kg/m     Physical Exam Vitals and nursing note reviewed.  Constitutional:      General: He is not in acute distress.    Appearance: Normal appearance. He is obese.  HENT:     Head: Normocephalic and atraumatic.     Right Ear: Hearing, tympanic membrane, ear canal and external ear normal.     Left Ear: Hearing, tympanic membrane, ear canal and external ear normal.     Nose: Nose normal. No mucosal edema or rhinorrhea.     Right Turbinates: Not enlarged.     Left Turbinates: Not enlarged.     Mouth/Throat:     Mouth: Mucous membranes are dry.     Pharynx: Oropharynx is clear. No oropharyngeal exudate.  Eyes:     Pupils: Pupils are equal, round, and reactive to light.  Cardiovascular:     Rate and Rhythm: Normal rate and regular rhythm.     Pulses: Normal pulses.     Heart sounds: Normal heart sounds. No murmur.  Pulmonary:     Effort: Pulmonary effort is normal.     Breath sounds: No decreased breath sounds, wheezing or rales.  Abdominal:     General: Bowel sounds are normal. There is no distension.     Palpations: Abdomen is soft.     Tenderness: There is no abdominal tenderness.  Musculoskeletal:     Cervical back: Normal range of motion.     Right lower leg: No edema.     Left lower leg: No edema.  Lymphadenopathy:     Cervical: No cervical adenopathy.  Skin:    General: Skin is warm and dry.     Capillary Refill: Capillary refill takes less than 2 seconds.     Findings: No erythema or rash.  Neurological:     General: No focal deficit present.     Mental Status: He is alert and oriented to person, place, and time.     Motor: No weakness.     Coordination: Coordination normal.     Gait: Gait is intact.  Gait normal.  Psychiatric:        Mood and Affect: Mood normal.        Behavior: Behavior normal. Behavior is cooperative.         Thought Content: Thought content normal.        Judgment: Judgment normal.     Assessment & Plan:   Pulmonary embolism (HCC) Unprovoked pulmonary emboli on 08/2019 CT chest Started on Xarelto Has not seen hematology for formal evaluation of hypercoagulability Treatment course at this time will be 6 months  Plan: Continue Xarelto We will be unable to clear patient for cataract surgery until he is able to stop the Xarelto >>> Explained to patient that in January/2021 with Dr. Lamonte Sakai saw him he felt the patient will need to postpone his cataract surgery until after the 6 months of treatment with Xarelto Referred to hematology for further evaluation due to unprovoked PE  VTE (venous thromboembolism) Plan: Continue Xarelto Referral to hematology for evaluation of unprovoked PE in December/2020  OSA on CPAP Severe obstructive sleep apnea managed by cardiology  Plan: Continue management with cardiology  Dysphagia Concerned of her chronic cough as well as worsening cough when swallowing or drinking liquids  Plan: Recommended patient to follow back up with neurology who performed a swallow study on the patient in 2018 Referral placed to neurology for further evaluation  Chronic cough Pulmonary function testing reviewed with patient today Patient adherent to omeprazole Patient has started irbesartan 300 mg Patient is stop lisinopril Patient reports that cough has improved with stop of lisinopril Patient also having some difficulty swallowing as well as tolerating liquids 2018 swallow study does show mild aspiration risk  Plan: Continue omeprazole Continue irbesartan Cxry today  Referral back to neurology for further evaluation of dysphagia     Return in about 3 months (around 03/13/2020), or if symptoms worsen or fail to improve, for Follow up with Dr. Lamonte Sakai.   Lauraine Rinne, NP 12/12/2019   This appointment required 32 minutes of patient care (this includes  precharting, chart review, review of results, face-to-face care, etc.).

## 2019-12-12 NOTE — Assessment & Plan Note (Addendum)
Pulmonary function testing reviewed with patient today Patient adherent to omeprazole Patient has started irbesartan 300 mg Patient is stop lisinopril Patient reports that cough has improved with stop of lisinopril Patient also having some difficulty swallowing as well as tolerating liquids 2018 swallow study does show mild aspiration risk  Plan: Continue omeprazole Continue irbesartan Cxry today  Referral back to neurology for further evaluation of dysphagia

## 2019-12-12 NOTE — Progress Notes (Signed)
PFT done today. 

## 2019-12-12 NOTE — Assessment & Plan Note (Signed)
Plan: Continue Xarelto Referral to hematology for evaluation of unprovoked PE in December/2020

## 2019-12-12 NOTE — Assessment & Plan Note (Signed)
Severe obstructive sleep apnea managed by cardiology  Plan: Continue management with cardiology

## 2019-12-12 NOTE — Assessment & Plan Note (Signed)
Unprovoked pulmonary emboli on 08/2019 CT chest Started on Xarelto Has not seen hematology for formal evaluation of hypercoagulability Treatment course at this time will be 6 months  Plan: Continue Xarelto We will be unable to clear patient for cataract surgery until he is able to stop the Xarelto >>> Explained to patient that in January/2021 with Dr. Lamonte Sakai saw him he felt the patient will need to postpone his cataract surgery until after the 6 months of treatment with Xarelto Referred to hematology for further evaluation due to unprovoked PE

## 2019-12-13 ENCOUNTER — Telehealth: Payer: Self-pay | Admitting: Hematology

## 2019-12-13 ENCOUNTER — Encounter: Payer: Self-pay | Admitting: Hematology

## 2019-12-13 NOTE — Telephone Encounter (Signed)
Received a new hem referral from [place] for Multiple subsegmental pulmonary emboli without acute cor pulmonale. Corey Gill has been cld and scheduled to see Dr. Irene Limbo on 4/19 at 11am. Pt aware to arrive 15 minutes early. Letter mailed.

## 2019-12-14 ENCOUNTER — Telehealth: Payer: Self-pay | Admitting: Emergency Medicine

## 2019-12-14 NOTE — Telephone Encounter (Signed)
Corey Rinne, NP  12/13/2019 10:15 AM EDT    Your chest x-ray results of come back. Showing no acute changes. No plan of care changes at this time. Keep follow-up appointment.   Follow-up with our office if symptoms worsen or you do not feel like you are improving under her current regimen.  It was a pleasure taking care of you,  Corey Quaker, FNP   Called and spoke with pt letting him know the results of the cxr and he verbalized understanding. Nothing further needed.

## 2019-12-15 ENCOUNTER — Telehealth: Payer: Self-pay | Admitting: Pulmonary Disease

## 2019-12-15 DIAGNOSIS — R1314 Dysphagia, pharyngoesophageal phase: Secondary | ICD-10-CM

## 2019-12-15 NOTE — Telephone Encounter (Signed)
Pt returning call.  [phone].

## 2019-12-15 NOTE — Telephone Encounter (Signed)
12/15/2019  Received message from with our neurology team they stated they felt the patient needed a neuromuscular specialist who is currently out of the office and will be out until July/2021.  They are recommending referring the patient to a different neurology team.  Patient has previously seen [facility] neurology per chart review.  Will refer back to them today.  Seen by Dr. Primus Bravo in February/2019.  Assessment and plan from that note is listed below:  Assessment: Patient is a 69 year old male who presents to neurology clinic regarding multiple complaints. His neurologic exam is non focal. Based on history, symptoms could be explained by [facility], as other etiologies seemed to have been ruled out based on extensive and negative neurologic work up. Some considerations including early signs of PD, CJD, (very unlikely given long course and no family history), or possibly some symptoms are psychogenic. As he had most neurologic testing to consider, felt that repeating testing would not be beneficial. If he develops new or worsening symptoms we can consider repeat testing or maybe repeat work up in 6 months. Otherwise I am not sure what could be causing these symptoms or whether or not they are secondary to an underlying neurologic process.   Plan: - no additional recommendations at this time - can consider repeat testing after 6 months  - return to clinic as needed  Greater than 50% of the 60 minute visit was spent in counseling/coordination of care for the patient.  It has been a pleasure to participate in the care of this patient.  Sincerely, Electronically signed by: Biagio Borg, MD   We recommend the patient present back to [facility] neurology for evaluation.  Patient likely is also having aspiration.  May be beneficial for him to have the repeat testing but that was mentioned in the February/2019 neurology note listed above.  Referral to [facility] neurology is already been  placed.  Please notify the patient this is switching locations.  If the patient would prefer to not be seen by [facility] neurology then we can place a referral to [facility] or [facility] neurological team.  [name], FNP

## 2019-12-15 NOTE — Telephone Encounter (Signed)
Called patient to let him know about referral to [facility] Neurology.  VM left for patient to call the office.

## 2019-12-19 NOTE — Telephone Encounter (Signed)
Pt returning a phone call. Pt can be reached at [phone]

## 2019-12-19 NOTE — Telephone Encounter (Signed)
Reached patient and let him know that the referral for neurology has been sent to [facility] Neurology with Dr. Primus Bravo.  Let him know that he would be contacted by the office to schedule an appointment.
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
## 2020-01-02 ENCOUNTER — Inpatient Hospital Stay: Payer: Medicare Other

## 2020-01-02 ENCOUNTER — Inpatient Hospital Stay: Payer: Medicare Other | Attending: Hematology | Admitting: Hematology

## 2020-01-02 ENCOUNTER — Other Ambulatory Visit: Payer: Self-pay

## 2020-01-02 ENCOUNTER — Telehealth: Payer: Self-pay | Admitting: Hematology

## 2020-01-02 VITALS — BP 155/71 | HR 64 | Temp 98.2°F | Resp 18 | Ht 70.0 in | Wt 204.8 lb

## 2020-01-02 DIAGNOSIS — Z8042 Family history of malignant neoplasm of prostate: Secondary | ICD-10-CM

## 2020-01-02 DIAGNOSIS — Z79899 Other long term (current) drug therapy: Secondary | ICD-10-CM | POA: Diagnosis not present

## 2020-01-02 DIAGNOSIS — Z9089 Acquired absence of other organs: Secondary | ICD-10-CM | POA: Diagnosis not present

## 2020-01-02 DIAGNOSIS — Z7984 Long term (current) use of oral hypoglycemic drugs: Secondary | ICD-10-CM | POA: Diagnosis not present

## 2020-01-02 DIAGNOSIS — Z87891 Personal history of nicotine dependence: Secondary | ICD-10-CM | POA: Diagnosis not present

## 2020-01-02 DIAGNOSIS — I2693 Single subsegmental pulmonary embolism without acute cor pulmonale: Secondary | ICD-10-CM | POA: Diagnosis not present

## 2020-01-02 DIAGNOSIS — Z86010 Personal history of colonic polyps: Secondary | ICD-10-CM | POA: Diagnosis not present

## 2020-01-02 DIAGNOSIS — I10 Essential (primary) hypertension: Secondary | ICD-10-CM | POA: Diagnosis not present

## 2020-01-02 DIAGNOSIS — I2694 Multiple subsegmental pulmonary emboli without acute cor pulmonale: Secondary | ICD-10-CM

## 2020-01-02 DIAGNOSIS — E785 Hyperlipidemia, unspecified: Secondary | ICD-10-CM

## 2020-01-02 DIAGNOSIS — E119 Type 2 diabetes mellitus without complications: Secondary | ICD-10-CM

## 2020-01-02 DIAGNOSIS — Z7901 Long term (current) use of anticoagulants: Secondary | ICD-10-CM | POA: Diagnosis not present

## 2020-01-02 DIAGNOSIS — Z840 Family history of diseases of the skin and subcutaneous tissue: Secondary | ICD-10-CM

## 2020-01-02 DIAGNOSIS — I700 Atherosclerosis of aorta: Secondary | ICD-10-CM | POA: Insufficient documentation

## 2020-01-02 DIAGNOSIS — D6859 Other primary thrombophilia: Secondary | ICD-10-CM

## 2020-01-02 DIAGNOSIS — I2510 Atherosclerotic heart disease of native coronary artery without angina pectoris: Secondary | ICD-10-CM | POA: Insufficient documentation

## 2020-01-02 DIAGNOSIS — K219 Gastro-esophageal reflux disease without esophagitis: Secondary | ICD-10-CM | POA: Diagnosis not present

## 2020-01-02 LAB — CMP (CANCER CENTER ONLY)
ALT: 22 U/L (ref 0–44)
AST: 29 U/L (ref 15–41)
Albumin: 4.2 g/dL (ref 3.5–5.0)
Alkaline Phosphatase: 65 U/L (ref 38–126)
Anion gap: 7 (ref 5–15)
BUN: 12 mg/dL (ref 8–23)
CO2: 26 mmol/L (ref 22–32)
Calcium: 8.9 mg/dL (ref 8.9–10.3)
Chloride: 106 mmol/L (ref 98–111)
Creatinine: 1.1 mg/dL (ref 0.61–1.24)
GFR, Est AFR Am: >60 mL/min (ref >60)
GFR, Estimated: >60 mL/min (ref >60)
Glucose, Bld: 111 mg/dL — ABNORMAL HIGH (ref 70–99)
Potassium: 4.4 mmol/L (ref 3.5–5.1)
Sodium: 139 mmol/L (ref 135–145)
Total Bilirubin: 1.3 mg/dL — ABNORMAL HIGH (ref 0.3–1.2)
Total Protein: 7.2 g/dL (ref 6.5–8.1)

## 2020-01-02 LAB — CBC WITH DIFFERENTIAL/PLATELET
Abs Immature Granulocytes: 0.01 10*3/uL (ref 0.00–0.07)
Basophils Absolute: 0.1 10*3/uL (ref 0.0–0.1)
Basophils Relative: 1 %
Eosinophils Absolute: 0.1 10*3/uL (ref 0.0–0.5)
Eosinophils Relative: 2 %
HCT: 44.4 % (ref 39.0–52.0)
Hemoglobin: 14.9 g/dL (ref 13.0–17.0)
Immature Granulocytes: 0 %
Lymphocytes Relative: 47 %
Lymphs Abs: 2.6 10*3/uL (ref 0.7–4.0)
MCH: 29 pg (ref 26.0–34.0)
MCHC: 33.6 g/dL (ref 30.0–36.0)
MCV: 86.4 fL (ref 80.0–100.0)
Monocytes Absolute: 0.5 10*3/uL (ref 0.1–1.0)
Monocytes Relative: 10 %
Neutro Abs: 2.2 10*3/uL (ref 1.7–7.7)
Neutrophils Relative %: 40 %
Platelets: 173 10*3/uL (ref 150–400)
RBC: 5.14 MIL/uL (ref 4.22–5.81)
RDW: 13.1 % (ref 11.5–15.5)
WBC: 5.5 10*3/uL (ref 4.0–10.5)
nRBC: 0 % (ref 0.0–0.2)

## 2020-01-02 NOTE — Telephone Encounter (Signed)
Scheduled per 04/19 los, patient will be updated per My chart.

## 2020-01-03 LAB — CARDIOLIPIN ANTIBODIES, IGG, IGM, IGA
Anticardiolipin IgA: <9 APL U/mL (ref 0–11)
Anticardiolipin IgG: <9 GPL U/mL (ref 0–14)
Anticardiolipin IgM: 16 MPL U/mL — ABNORMAL HIGH (ref 0–12)

## 2020-01-03 LAB — BETA-2-GLYCOPROTEIN I ABS, IGG/M/A
Beta-2 Glyco I IgG: <9 GPI IgG units (ref 0–20)
Beta-2-Glycoprotein I IgA: <9 GPI IgA units (ref 0–25)
Beta-2-Glycoprotein I IgM: 18 GPI IgM units (ref 0–32)

## 2020-01-04 LAB — LUPUS ANTICOAGULANT PANEL
DRVVT: 69 s — ABNORMAL HIGH (ref 0.0–47.0)
PTT Lupus Anticoagulant: 41.3 s (ref 0.0–51.9)

## 2020-01-04 LAB — DRVVT CONFIRM: dRVVT Confirm: 1.5 ratio — ABNORMAL HIGH (ref 0.8–1.2)

## 2020-01-04 LAB — PROTHROMBIN GENE MUTATION

## 2020-01-04 LAB — DRVVT MIX: dRVVT Mix: 53.5 s — ABNORMAL HIGH (ref 0.0–40.4)

## 2020-01-05 LAB — FACTOR 5 LEIDEN
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
## 2020-01-19 ENCOUNTER — Inpatient Hospital Stay: Payer: Medicare Other | Attending: Hematology | Admitting: Hematology

## 2020-01-19 DIAGNOSIS — D6859 Other primary thrombophilia: Secondary | ICD-10-CM

## 2020-01-19 DIAGNOSIS — I2694 Multiple subsegmental pulmonary emboli without acute cor pulmonale: Secondary | ICD-10-CM

## 2020-01-25 ENCOUNTER — Telehealth: Payer: Self-pay | Admitting: Cardiology

## 2020-01-25 NOTE — Telephone Encounter (Signed)
New Message:     Pt wants to know when is he supposed to have his next lab work? He said he thought Dr Radford Pax said she wanted to keep up with his lab work.

## 2020-01-25 NOTE — Telephone Encounter (Signed)
Patient called because he received a recall letter that he was due to see Dr. Radford Pax and was not sure if he needed to have lab work done or what the letter was about. It looks like he was sent an old recall letter. He was most recently seen by Dr. Radford Pax on 02/18 and per office note he can follow-up in one year. His most recent lab work was done on 03/09 which was normal. Advised patient that there was no indication that he needed blood work or a follow appt at this time.

## 2020-02-10 ENCOUNTER — Other Ambulatory Visit: Payer: Self-pay | Admitting: Emergency Medicine

## 2020-03-20 ENCOUNTER — Telehealth: Payer: Self-pay | Admitting: Cardiology

## 2020-03-20 ENCOUNTER — Telehealth: Payer: Self-pay | Admitting: Emergency Medicine

## 2020-03-20 NOTE — Telephone Encounter (Signed)
03/20/20  I have reviewed patient's chart.  Excerpt of the assessment and plan from the last office visit with him in March/2021 is listed below:  Pulmonary embolism ([facility]) Unprovoked pulmonary emboli on 08/2019 CT chest Started on Xarelto Has not seen hematology for formal evaluation of hypercoagulability Treatment course at this time will be 6 months  Plan: Continue Xarelto We will be unable to clear patient for cataract surgery until he is able to stop the Xarelto >>> Explained to patient that in January/2021 with Dr. Lamonte Sakai saw him he felt the patient will need to postpone his cataract surgery until after the 6 months of treatment with Xarelto Referred to hematology for further evaluation due to unprovoked PE  Patient is also completed follow-up with hematology.  An excerpt of their assessment and plan is listed below:  1) Unprovoked multiple segmental and subsegmental Pulmonary embolism to Left upper lobe  PLAN: -Discussed pt labwork today, 01/02/20; of CBC w/diff and CMP is as follows: all values are WNL except for Glucose at 111, Total Bilirubin at 1.3 -Discussed 01/02/20 of Factor 5 Leiden at Negative -Discussed 01/02/20 of Prothrombin Gene Mutation at Negative  -Discussed 01/02/20 of Lupus Anticoagulant Panel is as follows: all values are WNL except for DRVVT at 69 -Discussed 01/02/20 of Cardiolipin Antibodies, IgG, IgM, IgA is as follows: all values are WNL except for Anticardiolipin IgM at 16 -Discussed 01/02/20 of Beta-2-Glycoprotein I abs, IgG/M/A is as follows: all values are WNL -Discussed 01/02/20 of dRVVT Mix at 53.3 -Discussed 01/02/20 of dRVVT Confirm at 1.5 -Advised cough may take a while to go away -Advised cough may be due to other factors not related to the clot  -Advised waiting for cataract surgery until after 9 months minimum 6 months  -Advised assume blood clots dissolve unless symptoms worsen, or new symptoms present  -Advised pt that his blood clot was  unprovoked and the risk of repeat blood clots after an unprovoked event is 10-15% per year.  -Advised pt that chronic blood clots do not always completely dissolve, but that these clots do not pose a significant risk of dislodging -Advised pt that we would do repeat CT Chest if there are new symptoms such as: worsening SOB, new chest pain, low O2 sat, or tachycardia -Advised pt that anticoagulation would need to be held for 48 hours prior to cataract surgery and for 24 hours after.  -Advised Lupus Anticoagulant could be positive due to Xarelto- repeat must be done 3 months apart and off Xarelto for 48 hours prior for true positive  -Recommend pt use anticoagulation for 6 months continuously before holding it for an elective procedure.  -Recommend pt stay up-to-date with age-appropriate cancer screenings  -Continue 20 mg Xarelto per day  -Recommends repeat Lupus Anticoagulant Panel in 3 months with holding Xarelto 48 hours prior -will not change recommendation for long term anticoagulation. -Continue to f/u with PCP -F/u with cardiologist  -Recommends long-term blood thinners  -Will see back as needed    Based off the recommendations above it was recommended by hematology that patient would need 6 months at least of treatment.  Patient should seek recommendations from hematology.  They can make the formal decision regarding surgical follow-up.  Please also route this message to Dr. Lamonte Sakai.  Please make sure patient has follow-up scheduled with Dr. Lamonte Sakai.  [name], FNP

## 2020-03-20 NOTE — Telephone Encounter (Signed)
Patient is aware of this recommendation I have made a follow up appointment with RB. And patient is aware to follow up with hematology as well. Nothing further needed at this time.

## 2020-03-20 NOTE — Telephone Encounter (Signed)
        [facility] Pre-operative Risk Assessment    HEARTCARE STAFF: - Please ensure there is not already an duplicate clearance open for this procedure. - Under Visit Info/Reason for Call, type in Other and utilize the format Clearance MM/DD/YY or Clearance TBD. Do not use dashes or single digits. - If request is for dental extraction, please clarify the # of teeth to be extracted.  Request for surgical clearance:  1. What type of surgery is being performed? Cataract surgery  2. When is this surgery scheduled? 07/11/2020 tentative date  3. What type of clearance is required (medical clearance vs. Pharmacy clearance to hold med vs. Both)? Medical  4. Are there any medications that need to be held prior to surgery and how long? No need stop Blood thinner   5. Practice name and name of physician performing surgery? Dr. Jola Schmidt  6. What is the office phone number? [phone]   7.   What is the office fax number? [phone]  8.   Anesthesia type (None, local, MAC, general) ? topical IV MAC   Angeline S Hammer 03/20/2020, 4:05 PM  _________________________________________________________________   (provider comments below)

## 2020-03-20 NOTE — Telephone Encounter (Signed)
Patient is on the Xarelto and wanted to consult  With provider weather or not he can come off this medication for the surgery. Also if an appointment is needed to advise on this.  No current issues at this time.   Consult on 06/05/20 to set  Eye surgery date.   Last saw Corey Gill on 12/12/19.  [name] please advise

## 2020-03-20 NOTE — Telephone Encounter (Signed)
Patient calling about surgical clearance. Advised patient to have that office call us so we can fill out a surgical clearance form.

## 2020-03-20 NOTE — Telephone Encounter (Signed)
   Primary Cardiologist: Fransico Him, MD  Chart reviewed as part of pre-operative protocol coverage. Cataract extractions are recognized in guidelines as low risk surgeries that do not typically require specific preoperative testing or holding of blood thinner therapy. Therefore, given past medical history and time since last visit, based on ACC/AHA guidelines, Corey Gill would be at acceptable risk for the planned procedure without further cardiovascular testing.   I will route this recommendation to the requesting party via Epic fax function and remove from pre-op pool.  Please call with questions.  [place], [place] 03/20/2020, 6:46 PM

## 2020-03-21 DIAGNOSIS — L309 Dermatitis, unspecified: Secondary | ICD-10-CM | POA: Diagnosis not present

## 2020-03-21 DIAGNOSIS — L308 Other specified dermatitis: Secondary | ICD-10-CM | POA: Diagnosis not present

## 2020-03-21 DIAGNOSIS — L900 Lichen sclerosus et atrophicus: Secondary | ICD-10-CM | POA: Diagnosis not present

## 2020-03-26 ENCOUNTER — Telehealth: Payer: Self-pay | Admitting: Cardiology

## 2020-03-26 ENCOUNTER — Telehealth: Payer: Self-pay | Admitting: Emergency Medicine

## 2020-03-26 NOTE — Telephone Encounter (Signed)
Corey Gill is calling stating he recently went to his dermatologist last Tuesday on 03/20/20 and they stated he is having a reaction to one of his medications, but Corey Gill is unsure on which one. Please advise.

## 2020-03-26 NOTE — Telephone Encounter (Signed)
Spoke with the patient who states that last week he broke out in a rash all over his body. He described it as red blotches. He states that he went to his dermatologist who did a biopsy and determined it to be a medication eruption. He was advised to discontinue most recent medications for a few weeks. Patient states he has stopped taking amlodipine, irbesartan, and glimepiride. He also was prescribed a cream to use for the rash. He states rash has not gotten any worse.  Patient has not had any recent changes to medications. Advised patient to continue on Xarelto. Patient states that his BP has been 138-142/78-82 without being on BP meds. Advised patient to also follow up with his PCP. Patient verbalized understanding.

## 2020-03-26 NOTE — Telephone Encounter (Signed)
See patient comment regarding biopsy results.  Patient has not been seen here since March and at that time, it does not appear he was started on any new medications.  He has been seen by Cardiology and Hematology.  Dr. Lamonte Sakai, please advise.  Thank you.

## 2020-03-26 NOTE — Telephone Encounter (Signed)
There is not one drug in particular that I would think would be causing. I agree with holding meds (except xarelto) and resuming one at a time once rash resolves. He didn't start anything new recently

## 2020-03-27 NOTE — Telephone Encounter (Signed)
I do not see any obvious potential causes for a drug-related rash on his medication list.  If he would like to be seen to review his medications he can either see me or we can set up a dedicated medication review appointment with TP.

## 2020-03-27 NOTE — Telephone Encounter (Signed)
Follow up ° ° ° ° ° °Returning a call to the nurse °

## 2020-03-27 NOTE — Telephone Encounter (Signed)
Left message for patient to call back  

## 2020-03-27 NOTE — Telephone Encounter (Signed)
Spoke with the patient and advised him of recommendations. Patient verbalized understanding.  He has also called his PCP and pulmonologist.

## 2020-03-27 NOTE — Telephone Encounter (Signed)
Called and spoke with pt letting him know the info stated by RB and he verbalized understanding but stated that the dermatologist did determine that the rash was drug related but did not know from which med. Pt stated that his dermatologist told him to slowly wean himself back on amlodipine, glimepiride, and irbesartan one at a time that way he could see if the rash developed again. Pt stated the newest med was the irbesartan so he is wondering if it could be coming from that. Stated to him that he now needs to further discuss irbesartan with either PCP or cardiologist per last refill of med from [facility] and he verbalized understanding. Pt stated if rash began again once introduced irbesartan into system that he would call PCP. Nothing further needed.

## 2020-04-10 ENCOUNTER — Other Ambulatory Visit: Payer: Self-pay | Admitting: Cardiology

## 2020-04-10 NOTE — Telephone Encounter (Signed)
Pt last saw Dr Radford Pax 11/03/19, last labs 01/02/20 Creat 1.10, age 69, weight 92.9kg, CrCl 84.45, based on CrCl pt is on appropriate dosage of Xarelto 20mg  QD.  Will refill rx.

## 2020-05-08 ENCOUNTER — Ambulatory Visit: Payer: Medicare Other | Admitting: Emergency Medicine

## 2020-05-08 ENCOUNTER — Encounter: Payer: Self-pay | Admitting: Emergency Medicine

## 2020-05-08 ENCOUNTER — Other Ambulatory Visit: Payer: Self-pay

## 2020-05-08 DIAGNOSIS — I2694 Multiple subsegmental pulmonary emboli without acute cor pulmonale: Secondary | ICD-10-CM

## 2020-05-08 NOTE — Progress Notes (Signed)
Subjective:    Patient ID: Corey Gill, male    DOB: [birth year], 69 y.o.   MRN: [id number]  HPI 69 year old former smoker (7 pack years) with a history of diabetes mellitus, hypertension on an ACE inhibitor for 5 years, obstructive sleep apnea on CPAP (Dr. Radford Gill), psoriasis, possible prior CVA that may have affected swallowing.   He was experiencing some chest discomfort in the evenings, some associated cough on f/u visit w Dr Corey Gill going back to late Summer 2020.  Prompted and underwent a coronary CTA on 08/25/2019.  This was reassuring with regard to coronary disease but did show a left upper lobe PA pulmonary embolism.  He was admitted to the hospital urgently for anticoagulation.  Chronicity of the clot was unclear, may have been preceded by extended travel - he travelled to [facility] 3 hours in Nov.  He is referred for his pulmonary embolism and also for chronic cough.  He reports that he has had coughing since a severe URI in 08/2018 associated with dyspnea. The SOB is a bit better since starting Xarelto - able to do some yard work, not quite back to baseline from last year. He still has cough, hoarseness, non-productive. Seems to be bothered by eating, may have near aspiration once a week. He has some associated L chest pain. He has rare GERD, controlled on omeprazole.   He is supposed to get a cataract surgery done, was scheduled for 10/19/19 but now postponed.   No PFT available.  CT cardiac angio 08/25/2019 reviewed by me showed multiple nonobstructive PE in the segmental and subsegmental left upper lobe PA branches.  ROV 05/08/20 --this follow-up visit for 69 year old man with a minimal smoking history, diabetes, hypertension, OSA on CPAP, psoriasis.  He was diagnosed with pulmonary embolism in December 2020.  He was treated with Xarelto, and is still on it.  Returns today for follow-up. He had a recent drug eruption, felt to be related to irbesartan.  He has seen Dr.Kale with hematology and  had a hypercoagulability panel.  It looks like the only positive was a lupus anticoagulant which may have been false positive due to Xarelto.  He recommended a repeat lupus anticoagulant panel after a break from Xarelto (48 hours).  Review of Systems As per HPI     Objective:   Physical Exam  Vitals:   05/08/20 1349  BP: (!) 146/76  Pulse: 85  Temp: 98 F (36.7 C)  TempSrc: Temporal  SpO2: 96%  Weight: 207 lb 9.6 oz (94.2 kg)  Height: 5\' 10"  (1.778 m)    Gen: Pleasant, well-nourished, in no distress,  normal affect  ENT: No lesions,  mouth clear,  oropharynx clear, no postnasal drip  Neck: No JVD, no stridor  Lungs: No use of accessory muscles, no crackles or wheezing on normal respiration, no wheeze on forced expiration  Cardiovascular: RRR, heart sounds normal, no murmur or gallops, no peripheral edema  Musculoskeletal: No deformities, no cyanosis or clubbing  Neuro: alert, awake, non focal  Skin: Warm, no lesions or rash      Assessment & Plan:  Pulmonary embolism ([facility]) Has been treated effectively with 8 months of Xarelto.  He had hypercoagulability labs drawn in April by Dr Corey Gill, apparently had a positive lupus anticoagulant but this could be compounded by his Xarelto.  It was recommended that he have a repeat lupus anticoagulant panel.  I will refer him back to see hematology to get this done.  Ultimately will defer to Dr.  Kale as to whether Corey Gill can come off Xarelto now or should stay on indefinitely.  Corey Apo, MD, PhD 05/08/2020, 2:06 PM [facility] [phone] or if no answer [phone]

## 2020-05-08 NOTE — Patient Instructions (Addendum)
Please stay on your Xarelto for now.  Follow with Dr Irene Limbo to check repeat blood work and then determine whether you should stay on Xarelto going forward. You may be able ultimately to stop it, but we will defer to Dr. Grier Mitts advice. Follow with Dr Lamonte Sakai if needed.

## 2020-05-08 NOTE — Assessment & Plan Note (Signed)
Has been treated effectively with 8 months of Xarelto.  He had hypercoagulability labs drawn in April by Dr Irene Limbo, apparently had a positive lupus anticoagulant but this could be compounded by his Xarelto.  It was recommended that he have a repeat lupus anticoagulant panel.  I will refer him back to see hematology to get this done.  Ultimately will defer to Dr. Irene Limbo as to whether Ray can come off Xarelto now or should stay on indefinitely.

## 2020-05-28 ENCOUNTER — Other Ambulatory Visit: Payer: Self-pay | Admitting: Emergency Medicine

## 2020-06-05 DIAGNOSIS — H2513 Age-related nuclear cataract, bilateral: Secondary | ICD-10-CM | POA: Diagnosis not present

## 2020-06-05 DIAGNOSIS — H401122 Primary open-angle glaucoma, left eye, moderate stage: Secondary | ICD-10-CM | POA: Diagnosis not present

## 2020-06-06 ENCOUNTER — Telehealth: Payer: Self-pay | Admitting: *Deleted

## 2020-06-06 NOTE — Telephone Encounter (Signed)
   [facility] Pre-operative Risk Assessment    HEARTCARE STAFF: - Please ensure there is not already an duplicate clearance open for this procedure. - Under Visit Info/Reason for Call, type in Other and utilize the format Clearance MM/DD/YY or Clearance TBD. Do not use dashes or single digits. - If request is for dental extraction, please clarify the # of teeth to be extracted.  Request for surgical clearance:  1. What type of surgery is being performed? CATARACT SURGERY  2. When is this surgery scheduled? 07/30/20  3. What type of clearance is required (medical clearance vs. Pharmacy clearance to hold med vs. Both)? BOTH  4. Are there any medications that need to be held prior to surgery and how long? NO MEDS LISTED THOUGH PT IS ON XARELTO  5. Practice name and name of physician performing surgery? [facility]; DR. Leory Plowman BOWEN  6. What is the office phone number? [phone]    7.   What is the office fax number? [phone]  8.   Anesthesia type (None, local, MAC, general) TOPICAL WITH MILD SEDATRION   Julaine Hua 06/06/2020, 1:15 PM  _________________________________________________________________   (provider comments below)

## 2020-06-06 NOTE — Telephone Encounter (Signed)
   Primary Cardiologist: Fransico Him, MD  Chart reviewed as part of pre-operative protocol coverage. Cataract extractions are recognized in guidelines as low risk surgeries that do not typically require specific preoperative testing or holding of blood thinner therapy. Therefore, given past medical history and time since last visit, based on ACC/AHA guidelines, Corey Gill would be at acceptable risk for the planned procedure without further cardiovascular testing.   I will route this recommendation to the requesting party via Epic fax function and remove from pre-op pool.  Please call with questions.  [place], [place] 06/06/2020, 1:57 PM

## 2020-06-09 ENCOUNTER — Other Ambulatory Visit: Payer: Self-pay | Admitting: Cardiology

## 2020-06-26 DIAGNOSIS — H2513 Age-related nuclear cataract, bilateral: Secondary | ICD-10-CM | POA: Diagnosis not present

## 2020-06-26 DIAGNOSIS — H401131 Primary open-angle glaucoma, bilateral, mild stage: Secondary | ICD-10-CM | POA: Diagnosis not present

## 2020-07-30 DIAGNOSIS — H25812 Combined forms of age-related cataract, left eye: Secondary | ICD-10-CM | POA: Diagnosis not present

## 2020-07-30 DIAGNOSIS — H2512 Age-related nuclear cataract, left eye: Secondary | ICD-10-CM | POA: Diagnosis not present

## 2020-07-30 DIAGNOSIS — H401122 Primary open-angle glaucoma, left eye, moderate stage: Secondary | ICD-10-CM | POA: Diagnosis not present

## 2020-09-03 DIAGNOSIS — H401122 Primary open-angle glaucoma, left eye, moderate stage: Secondary | ICD-10-CM | POA: Diagnosis not present

## 2020-09-10 DIAGNOSIS — L814 Other melanin hyperpigmentation: Secondary | ICD-10-CM | POA: Diagnosis not present

## 2020-09-10 DIAGNOSIS — L400 Psoriasis vulgaris: Secondary | ICD-10-CM | POA: Diagnosis not present

## 2020-09-10 DIAGNOSIS — L821 Other seborrheic keratosis: Secondary | ICD-10-CM | POA: Diagnosis not present

## 2020-09-10 DIAGNOSIS — L578 Other skin changes due to chronic exposure to nonionizing radiation: Secondary | ICD-10-CM | POA: Diagnosis not present

## 2020-09-27 ENCOUNTER — Other Ambulatory Visit: Payer: Self-pay | Admitting: Cardiology

## 2020-10-08 ENCOUNTER — Encounter (HOSPITAL_BASED_OUTPATIENT_CLINIC_OR_DEPARTMENT_OTHER): Payer: Self-pay | Admitting: *Deleted

## 2020-10-08 ENCOUNTER — Emergency Department (HOSPITAL_BASED_OUTPATIENT_CLINIC_OR_DEPARTMENT_OTHER): Payer: Medicare Other

## 2020-10-08 ENCOUNTER — Emergency Department (HOSPITAL_BASED_OUTPATIENT_CLINIC_OR_DEPARTMENT_OTHER)
Admission: EM | Admit: 2020-10-08 | Discharge: 2020-10-08 | Disposition: A | Discharge status: Home / Self Care | Payer: Medicare Other | Attending: Emergency Medicine | Admitting: Emergency Medicine

## 2020-10-08 ENCOUNTER — Other Ambulatory Visit: Payer: Self-pay

## 2020-10-08 DIAGNOSIS — Z87891 Personal history of nicotine dependence: Secondary | ICD-10-CM | POA: Insufficient documentation

## 2020-10-08 DIAGNOSIS — I2510 Atherosclerotic heart disease of native coronary artery without angina pectoris: Secondary | ICD-10-CM | POA: Insufficient documentation

## 2020-10-08 DIAGNOSIS — Z20822 Contact with and (suspected) exposure to covid-19: Secondary | ICD-10-CM | POA: Insufficient documentation

## 2020-10-08 DIAGNOSIS — J189 Pneumonia, unspecified organism: Secondary | ICD-10-CM | POA: Diagnosis not present

## 2020-10-08 DIAGNOSIS — Z7901 Long term (current) use of anticoagulants: Secondary | ICD-10-CM | POA: Diagnosis not present

## 2020-10-08 DIAGNOSIS — E1169 Type 2 diabetes mellitus with other specified complication: Secondary | ICD-10-CM | POA: Insufficient documentation

## 2020-10-08 DIAGNOSIS — J22 Unspecified acute lower respiratory infection: Secondary | ICD-10-CM | POA: Diagnosis not present

## 2020-10-08 DIAGNOSIS — Z79899 Other long term (current) drug therapy: Secondary | ICD-10-CM | POA: Insufficient documentation

## 2020-10-08 DIAGNOSIS — R059 Cough, unspecified: Secondary | ICD-10-CM

## 2020-10-08 DIAGNOSIS — Z8509 Personal history of malignant neoplasm of other digestive organs: Secondary | ICD-10-CM | POA: Diagnosis not present

## 2020-10-08 DIAGNOSIS — E785 Hyperlipidemia, unspecified: Secondary | ICD-10-CM | POA: Insufficient documentation

## 2020-10-08 DIAGNOSIS — Z7984 Long term (current) use of oral hypoglycemic drugs: Secondary | ICD-10-CM | POA: Diagnosis not present

## 2020-10-08 MED ORDER — BENZONATATE 100 MG PO CAPS: 100.0000 mg | ORAL_CAPSULE | Freq: Three times a day (TID) | ORAL | 0 refills | Status: DC

## 2020-10-08 NOTE — Discharge Instructions (Addendum)
You were seen in the emergency department today for cough as well as several other symptoms.  Your chest x-ray today was normal which is reassuring.  Please follow-up with your COVID-19 test that was obtained by your primary care provider. We are instructing patient's with COVID 19 or symptoms of COVID 19 to follow the below instructions regardless of vaccination status:  - Stay home for 5 days. - If you have no symptoms or your symptoms are resolving after 5 days, you can leave your house--> Continue to wear a mask around others for 5 additional days. - If you have a fever, continue to stay home until your fever resolves.days.   We are sending you home with Tessalon to take every 8 hours as needed for coughing.  We have prescribed you new medication(s) today. Discuss the medications prescribed today with your pharmacist as they can have adverse effects and interactions with your other medicines including over the counter and prescribed medications. Seek medical evaluation if you start to experience new or abnormal symptoms after taking one of these medicines, seek care immediately if you start to experience difficulty breathing, feeling of your throat closing, facial swelling, or rash as these could be indications of a more serious allergic reaction   Please follow up with primary care within 3-5 days for re-evaluation- call prior to going to the office to make them aware of your symptoms as some offices are altering their method of seeing patients with COVID 19 symptoms, we have also provided our [place] covid clinic for follow up as well.  Return to the ER for new or worsening symptoms including but not limited to increased work of breathing, chest pain, passing out, or any other concerns.       Person Under Monitoring Name: Corey Gill  Location: [place] [address]   Infection Prevention Recommendations for Individuals Confirmed to have, or Being Evaluated for,  2019 Novel Coronavirus (COVID-19) Infection Who Receive Care at Home  Individuals who are confirmed to have, or are being evaluated for, COVID-19 should follow the prevention steps below until a healthcare provider or local or state health department says they can return to normal activities.  Stay home except to get medical care You should restrict activities outside your home, except for getting medical care. Do not go to work, school, or public areas, and do not use public transportation or taxis.  Call ahead before visiting your doctor Before your medical appointment, call the healthcare provider and tell them that you have, or are being evaluated for, COVID-19 infection. This will help the healthcare provider's office take steps to keep other people from getting infected. Ask your healthcare provider to call the local or state health department.  Monitor your symptoms Seek prompt medical attention if your illness is worsening (e.g., difficulty breathing). Before going to your medical appointment, call the healthcare provider and tell them that you have, or are being evaluated for, COVID-19 infection. Ask your healthcare provider to call the local or state health department.  Wear a facemask You should wear a facemask that covers your nose and mouth when you are in the same room with other people and when you visit a healthcare provider. People who live with or visit you should also wear a facemask while they are in the same room with you.  Separate yourself from other people in your home As much as possible, you should stay in a different room from other people in your home. Also,  you should use a separate bathroom, if available.  Avoid sharing household items You should not share dishes, drinking glasses, cups, eating utensils, towels, bedding, or other items with other people in your home. After using these items, you should wash them thoroughly with soap and water.  Cover  your coughs and sneezes Cover your mouth and nose with a tissue when you cough or sneeze, or you can cough or sneeze into your sleeve. Throw used tissues in a lined trash can, and immediately wash your hands with soap and water for at least 20 seconds or use an alcohol-based hand rub.  Wash your [facility] your hands often and thoroughly with soap and water for at least 20 seconds. You can use an alcohol-based hand sanitizer if soap and water are not available and if your hands are not visibly dirty. Avoid touching your eyes, nose, and mouth with unwashed hands.   Prevention Steps for Caregivers and Household Members of Individuals Confirmed to have, or Being Evaluated for, COVID-19 Infection Being Cared for in the Home  If you live with, or provide care at home for, a person confirmed to have, or being evaluated for, COVID-19 infection please follow these guidelines to prevent infection:  Follow healthcare provider's instructions Make sure that you understand and can help the patient follow any healthcare provider instructions for all care.  Provide for the patient's basic needs You should help the patient with basic needs in the home and provide support for getting groceries, prescriptions, and other personal needs.  Monitor the patient's symptoms If they are getting sicker, call his or her medical provider and tell them that the patient has, or is being evaluated for, COVID-19 infection. This will help the healthcare provider's office take steps to keep other people from getting infected. Ask the healthcare provider to call the local or state health department.  Limit the number of people who have contact with the patient If possible, have only one caregiver for the patient. Other household members should stay in another home or place of residence. If this is not possible, they should stay in another room, or be separated from the patient as much as possible. Use a separate  bathroom, if available. Restrict visitors who do not have an essential need to be in the home.  Keep older adults, very young children, and other sick people away from the patient Keep older adults, very young children, and those who have compromised immune systems or chronic health conditions away from the patient. This includes people with chronic heart, lung, or kidney conditions, diabetes, and cancer.  Ensure good ventilation Make sure that shared spaces in the home have good air flow, such as from an air conditioner or an opened window, weather permitting.  Wash your hands often Wash your hands often and thoroughly with soap and water for at least 20 seconds. You can use an alcohol based hand sanitizer if soap and water are not available and if your hands are not visibly dirty. Avoid touching your eyes, nose, and mouth with unwashed hands. Use disposable paper towels to dry your hands. If not available, use dedicated cloth towels and replace them when they become wet.  Wear a facemask and gloves Wear a disposable facemask at all times in the room and gloves when you touch or have contact with the patient's blood, body fluids, and/or secretions or excretions, such as sweat, saliva, sputum, nasal mucus, vomit, urine, or feces.  Ensure the mask fits over your nose  and mouth tightly, and do not touch it during use. Throw out disposable facemasks and gloves after using them. Do not reuse. Wash your hands immediately after removing your facemask and gloves. If your personal clothing becomes contaminated, carefully remove clothing and launder. Wash your hands after handling contaminated clothing. Place all used disposable facemasks, gloves, and other waste in a lined container before disposing them with other household waste. Remove gloves and wash your hands immediately after handling these items.  Do not share dishes, glasses, or other household items with the patient Avoid sharing household  items. You should not share dishes, drinking glasses, cups, eating utensils, towels, bedding, or other items with a patient who is confirmed to have, or being evaluated for, COVID-19 infection. After the person uses these items, you should wash them thoroughly with soap and water.  Wash laundry thoroughly Immediately remove and wash clothes or bedding that have blood, body fluids, and/or secretions or excretions, such as sweat, saliva, sputum, nasal mucus, vomit, urine, or feces, on them. Wear gloves when handling laundry from the patient. Read and follow directions on labels of laundry or clothing items and detergent. In general, wash and dry with the warmest temperatures recommended on the label.  Clean all areas the individual has used often Clean all touchable surfaces, such as counters, tabletops, doorknobs, bathroom fixtures, toilets, phones, keyboards, tablets, and bedside tables, every day. Also, clean any surfaces that may have blood, body fluids, and/or secretions or excretions on them. Wear gloves when cleaning surfaces the patient has come in contact with. Use a diluted bleach solution (e.g., dilute bleach with 1 part bleach and 10 parts water) or a household disinfectant with a label that says EPA-registered for coronaviruses. To make a bleach solution at home, add 1 tablespoon of bleach to 1 quart (4 cups) of water. For a larger supply, add  cup of bleach to 1 gallon (16 cups) of water. Read labels of cleaning products and follow recommendations provided on product labels. Labels contain instructions for safe and effective use of the cleaning product including precautions you should take when applying the product, such as wearing gloves or eye protection and making sure you have good ventilation during use of the product. Remove gloves and wash hands immediately after cleaning.  Monitor yourself for signs and symptoms of illness Caregivers and household members are considered close  contacts, should monitor their health, and will be asked to limit movement outside of the home to the extent possible. Follow the monitoring steps for close contacts listed on the symptom monitoring form.   ? If you have additional questions, contact your local health department or call the epidemiologist on call at [phone] (available 24/7). ? This guidance is subject to change. For the most up-to-date guidance from [facility], please refer to their website: [email or web address]
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
## 2020-10-08 NOTE — ED Triage Notes (Signed)
Covid exposure. He has had a fever, body aches, cough, sore throat and fatigue for a week. He had a Covid test today. Results pending.

## 2020-10-08 NOTE — ED Notes (Signed)
Patient verbalizes understanding of discharge instructions. Opportunity for questioning and answers were provided. Armband removed by staff, pt discharged from ED ambulatory to home.  

## 2020-10-09 ENCOUNTER — Telehealth: Payer: Self-pay

## 2020-10-09 NOTE — Telephone Encounter (Signed)
[place] [phone] called pt verified DOB. Pt reports PCP is aware of ED visit. Pt states plans to contact PCP on Wednesday upon their return to the office. Pt states has a good support system at home. Pt verbalized understanding to call back to schedule here if unable to see PCP in a timely manner or if symptoms become worrisom.

## 2020-11-06 ENCOUNTER — Encounter: Payer: Self-pay | Admitting: Cardiology

## 2020-11-06 ENCOUNTER — Ambulatory Visit: Payer: Medicare Other | Admitting: Cardiology

## 2020-11-06 ENCOUNTER — Other Ambulatory Visit: Payer: Self-pay

## 2020-11-06 VITALS — BP 136/70 | HR 55 | Ht 70.0 in | Wt 202.0 lb

## 2020-11-06 DIAGNOSIS — R002 Palpitations: Secondary | ICD-10-CM

## 2020-11-06 DIAGNOSIS — G4733 Obstructive sleep apnea (adult) (pediatric): Secondary | ICD-10-CM

## 2020-11-06 DIAGNOSIS — E669 Obesity, unspecified: Secondary | ICD-10-CM

## 2020-11-06 DIAGNOSIS — I10 Essential (primary) hypertension: Secondary | ICD-10-CM

## 2020-11-06 DIAGNOSIS — I2510 Atherosclerotic heart disease of native coronary artery without angina pectoris: Secondary | ICD-10-CM | POA: Diagnosis not present

## 2020-11-06 DIAGNOSIS — I2699 Other pulmonary embolism without acute cor pulmonale: Secondary | ICD-10-CM | POA: Insufficient documentation

## 2020-11-06 DIAGNOSIS — E7800 Pure hypercholesterolemia, unspecified: Secondary | ICD-10-CM

## 2020-11-06 DIAGNOSIS — I2782 Chronic pulmonary embolism: Secondary | ICD-10-CM

## 2020-11-06 DIAGNOSIS — Z9989 Dependence on other enabling machines and devices: Secondary | ICD-10-CM

## 2020-11-06 LAB — ALT: ALT: 24 IU/L (ref 0–44)

## 2020-11-06 LAB — LIPID PANEL
Chol/HDL Ratio: 3.3 ratio (ref 0.0–5.0)
Cholesterol, Total: 177 mg/dL (ref 100–199)
HDL: 54 mg/dL (ref >39)
LDL Chol Calc (NIH): 97 mg/dL (ref 0–99)
Triglycerides: 150 mg/dL — ABNORMAL HIGH (ref 0–149)
VLDL Cholesterol Cal: 26 mg/dL (ref 5–40)

## 2020-11-06 NOTE — Addendum Note (Signed)
Addended by: Edman Circle on: 11/06/2020 09:51 AM   Modules accepted: Orders
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
## 2020-11-06 NOTE — Patient Instructions (Addendum)
Medication Instructions:  Your physician recommends that you continue on your current medications as directed. Please refer to the Current Medication list given to you today. *If you need a refill on your cardiac medications before your next appointment, please call your pharmacy*   Lab Work: Today: Fasting Lipids/ALT If you have labs (blood work) drawn today and your tests are completely normal, you will receive your results only by: [place] MyChart Message (if you have MyChart) OR . A paper copy in the mail If you have any lab test that is abnormal or we need to change your treatment, we will call you to review the results.   Testing/Procedures: Your physician has recommended that you wear an event monitor. Event monitors are medical devices that record the heart's electrical activity. Doctors most often [place] these monitors to diagnose arrhythmias. Arrhythmias are problems with the speed or rhythm of the heartbeat. The monitor is a small, portable device. You can wear one while you do your normal daily activities. This is usually used to diagnose what is causing palpitations/syncope (passing out).    Follow-Up: At [facility], you and your health needs are our priority.  As part of our continuing mission to provide you with exceptional heart care, we have created designated Provider Care Teams.  These Care Teams include your primary Cardiologist (physician) and Advanced Practice Providers (APPs -  Physician Assistants and Nurse Practitioners) who all work together to provide you with the care you need, when you need it.   Your next appointment:   12 month(s)  The format for your next appointment:   In Person  Provider:   You may see Fransico Him, MD  or one of the following Advanced Practice Providers on your designated Care Team:    Melina Copa, PA-C  Ermalinda Barrios, PA-C

## 2020-11-07 ENCOUNTER — Telehealth: Payer: Self-pay

## 2020-11-07 ENCOUNTER — Other Ambulatory Visit: Payer: Self-pay | Admitting: *Deleted

## 2020-11-07 DIAGNOSIS — E7800 Pure hypercholesterolemia, unspecified: Secondary | ICD-10-CM

## 2020-11-07 MED ORDER — RIVAROXABAN 20 MG PO TABS: ORAL_TABLET | ORAL | 5 refills | Status: AC

## 2020-11-07 MED ORDER — PRAVASTATIN SODIUM 80 MG PO TABS: 80.0000 mg | ORAL_TABLET | Freq: Every evening | ORAL | 3 refills | Status: DC

## 2020-11-07 NOTE — Telephone Encounter (Signed)
-----   Message from Sueanne Margarita, MD sent at 11/06/2020  5:10 PM EST ----- LDL not at goal - increase pravastatin to 80mg  daily and repeat FLP and ALT in 6 weeks

## 2020-11-07 NOTE — Telephone Encounter (Signed)
The patient has been notified of the result and verbalized understanding.  All questions (if any) were answered. Antonieta Iba, RN 11/07/2020 11:43 AM  Patient agreeable to increase in pravastatin. He will repeat labs in 6 weeks.

## 2020-11-07 NOTE — Telephone Encounter (Addendum)
Xarelto 20mg  paper refill request received. Pt is 70 years old, weight- 91.6kg, Crea-1.10 on 01/02/2020, last seen by Dr. Radford Pax on 11/06/20, Diagnosis-Pulmonary Emboli and Lupus, CrCl-82.16ml/min; Dose is appropriate based on dosing criteria. Will send in refill to requested pharmacy.

## 2020-11-12 ENCOUNTER — Telehealth: Payer: Self-pay | Admitting: Cardiology

## 2020-11-12 NOTE — Telephone Encounter (Signed)
    Pt said he received his heart monitor in the mail. He called [employer] and was told they are out of network on his [employer] and will cost him at least $190. He wanted to ask Dr. Radford Pax if he can have a different heart monitor for him who is in network on his insurance

## 2020-11-13 NOTE — Telephone Encounter (Signed)
Called Corey Gill at [employer] to inquire about questionable out of network [employer].  We both agreed this should not be out of network, we have had multiple patients in the past with [facility].  He also stated if his insurance was out of network he would have been quoted a larger out of pocket cost.  Self pay cost is $350.00, $225.00 if paid within 7 days.  Highly unlikely any alternative company would be less expensive.  He will inquire with company and get back to me.

## 2020-11-14 ENCOUNTER — Telehealth: Payer: Self-pay | Admitting: Cardiology

## 2020-11-14 NOTE — Telephone Encounter (Signed)
Mitch called with the following information.   [employer] is in network with [employer].  Patients out of pocket cost for a 30 day Cardiac Event Monitor  is a 20% copay of $190.05.  This is the least expensive option we have out of the 3 monitor companies we work with. [employer] offers a sliding scale patient assistance program that Mr. Halley could apply for.  If he qualifies for this he could get a 14 day ZIO XT patch holter monitor st a discounted rate, but that would only be if he qualified for that program. I explained to Mr Lutze, he would not be charged for his [employer] monitor if he ships it back unused in the box with the prepaid UPS shipping label on it. He wanted to know if Dr. Radford Pax felt the monitor was that necessary.

## 2020-11-14 NOTE — Telephone Encounter (Signed)
Follow Up:       Pt is calling to find out what was found out about his insurance paying for his Monitor.

## 2020-11-14 NOTE — Telephone Encounter (Signed)
Yes he needs the  monitor to make sure he is not having atrial fibrillation which could result in increased risk of stroke if not treated

## 2020-11-15 ENCOUNTER — Telehealth: Payer: Self-pay | Admitting: *Deleted

## 2020-11-15 NOTE — Telephone Encounter (Signed)
See telephone note from 2/28

## 2020-11-15 NOTE — Telephone Encounter (Signed)
Patient had wanted to check with Dr. Radford Pax to see if monitor was really necessary.  Called and LMVM regarding Dr.Turners response. She responded, yes the monitor is necessary to rule out atrial fibrillation which could put the patient at an increased risk of stroke if not treated.

## 2020-11-18 ENCOUNTER — Ambulatory Visit (INDEPENDENT_AMBULATORY_CARE_PROVIDER_SITE_OTHER): Payer: Medicare Other

## 2020-11-18 DIAGNOSIS — R002 Palpitations: Secondary | ICD-10-CM

## 2020-12-06 DIAGNOSIS — I10 Essential (primary) hypertension: Secondary | ICD-10-CM | POA: Diagnosis not present

## 2020-12-06 DIAGNOSIS — Z86711 Personal history of pulmonary embolism: Secondary | ICD-10-CM | POA: Diagnosis not present

## 2020-12-06 DIAGNOSIS — E782 Mixed hyperlipidemia: Secondary | ICD-10-CM | POA: Diagnosis not present

## 2020-12-06 DIAGNOSIS — E1169 Type 2 diabetes mellitus with other specified complication: Secondary | ICD-10-CM | POA: Diagnosis not present

## 2020-12-07 ENCOUNTER — Other Ambulatory Visit: Payer: Self-pay | Admitting: Cardiology

## 2020-12-11 DIAGNOSIS — H401123 Primary open-angle glaucoma, left eye, severe stage: Secondary | ICD-10-CM | POA: Diagnosis not present

## 2020-12-24 ENCOUNTER — Telehealth: Payer: Self-pay

## 2020-12-24 NOTE — Telephone Encounter (Signed)
-----   Message from Sueanne Margarita, MD sent at 12/20/2020  7:18 PM EDT ----- Normal heart monitor =- please find out if he had any palpitations while wearing the monitor

## 2020-12-24 NOTE — Telephone Encounter (Signed)
Called patient notified of monitor results. He verbalizes understanding.  He reports that he did have palpitations while wearing monitor.  The palpitations were not often but he did have them.  He became "irritated" with monitor.  He expressed that monitor was on properly but it would alert poor skin contact.  He feels that the signal may not have been good.  He does not want to ever use this heart monitor again.

## 2021-01-02 ENCOUNTER — Other Ambulatory Visit: Payer: Self-pay

## 2021-01-02 ENCOUNTER — Other Ambulatory Visit: Payer: Medicare Other | Admitting: *Deleted

## 2021-01-02 DIAGNOSIS — E7800 Pure hypercholesterolemia, unspecified: Secondary | ICD-10-CM

## 2021-01-02 LAB — LIPID PANEL
Chol/HDL Ratio: 2.4 ratio (ref 0.0–5.0)
Cholesterol, Total: 158 mg/dL (ref 100–199)
HDL: 66 mg/dL (ref >39)
LDL Chol Calc (NIH): 70 mg/dL (ref 0–99)
Triglycerides: 126 mg/dL (ref 0–149)
VLDL Cholesterol Cal: 22 mg/dL (ref 5–40)

## 2021-01-02 LAB — ALT: ALT: 23 IU/L (ref 0–44)

## 2021-01-06 ENCOUNTER — Other Ambulatory Visit: Payer: Self-pay | Admitting: Emergency Medicine

## 2021-04-02 DIAGNOSIS — H401123 Primary open-angle glaucoma, left eye, severe stage: Secondary | ICD-10-CM | POA: Diagnosis not present

## 2021-04-11 ENCOUNTER — Other Ambulatory Visit: Payer: Self-pay | Admitting: Cardiology

## 2021-06-03 DIAGNOSIS — I10 Essential (primary) hypertension: Secondary | ICD-10-CM | POA: Diagnosis not present

## 2021-06-03 DIAGNOSIS — E1169 Type 2 diabetes mellitus with other specified complication: Secondary | ICD-10-CM | POA: Diagnosis not present

## 2021-06-03 DIAGNOSIS — E782 Mixed hyperlipidemia: Secondary | ICD-10-CM | POA: Diagnosis not present

## 2021-07-19 DIAGNOSIS — H401123 Primary open-angle glaucoma, left eye, severe stage: Secondary | ICD-10-CM | POA: Diagnosis not present

## 2021-08-20 DIAGNOSIS — H401123 Primary open-angle glaucoma, left eye, severe stage: Secondary | ICD-10-CM | POA: Diagnosis not present

## 2021-09-17 DIAGNOSIS — L821 Other seborrheic keratosis: Secondary | ICD-10-CM | POA: Diagnosis not present

## 2021-09-17 DIAGNOSIS — L578 Other skin changes due to chronic exposure to nonionizing radiation: Secondary | ICD-10-CM | POA: Diagnosis not present

## 2021-09-17 DIAGNOSIS — L400 Psoriasis vulgaris: Secondary | ICD-10-CM | POA: Diagnosis not present

## 2021-09-17 DIAGNOSIS — L814 Other melanin hyperpigmentation: Secondary | ICD-10-CM | POA: Diagnosis not present

## 2021-10-27 IMAGING — CT CT HEART MORP W/ CTA COR W/ SCORE W/ CA W/CM &/OR W/O CM
4 of 7 series · 8 of 20 positions shown, 9 images · non-contrast
Comparison: None.
COMPARISON: None.

Addendum:
EXAM:
OVER-READ INTERPRETATION  CT CHEST

The following report is an over-read performed by radiologist Dr.
Juan C Chang [REDACTED] on 08/25/2019. This
over-read does not include interpretation of cardiac or coronary
anatomy or pathology. The coronary calcium score/coronary CTA
interpretation by the cardiologist is attached.
TECHNIQUE: The patient was scanned on a Phillips Force scanner.

[Series 6: best diast 73 % · axial · 0.39mm/px · z∈[+1189,+1231]mm · 2 of 316 slices shown, 3 images]
[im 106/316  vessel]
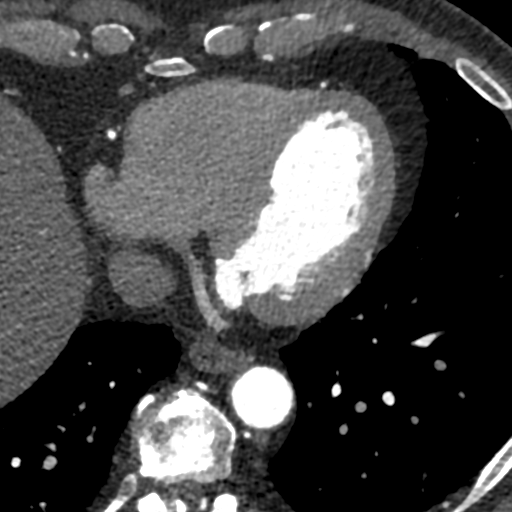
[im 106/316  lung]
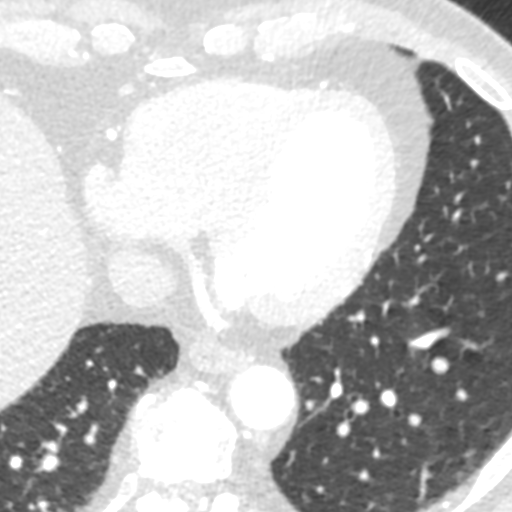
[im 211/316  vessel]
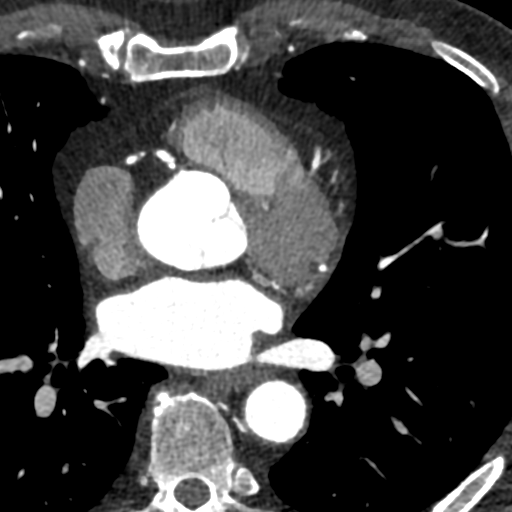

[Series 7: best syst 39 % · axial · 0.39mm/px · z∈[+1189,+1231]mm · 2 of 316 slices shown]
[im 106/316  vessel]
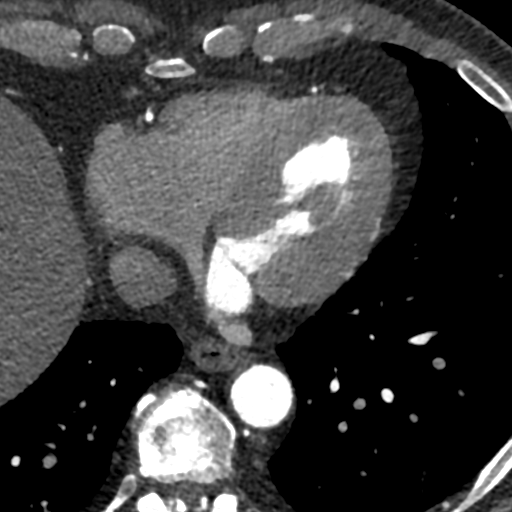
[im 211/316  vessel]
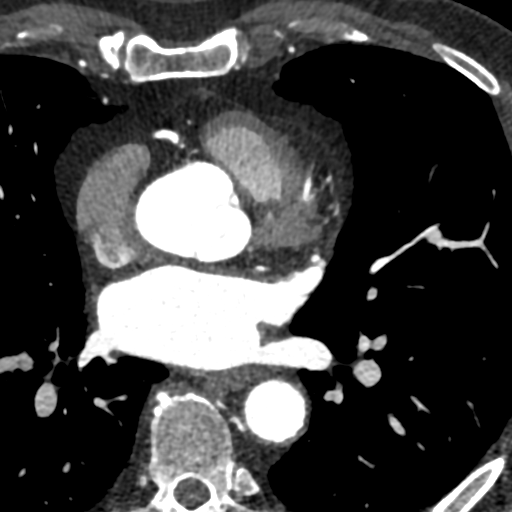

[Series 8: ts diast sharp 39 % · axial · 0.39mm/px · z∈[+1189,+1231]mm · 2 of 316 slices shown]
[im 106/316  lung]
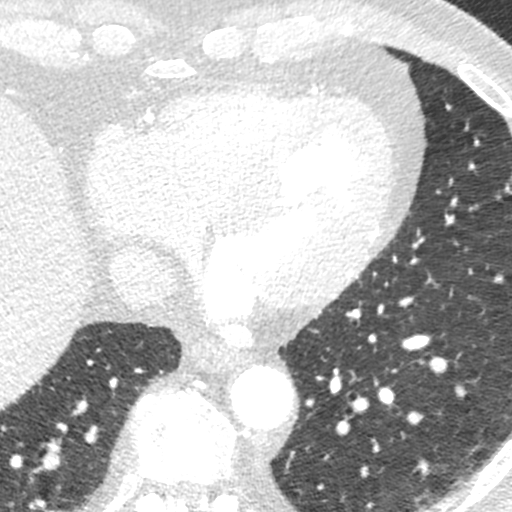
[im 211/316  lung]
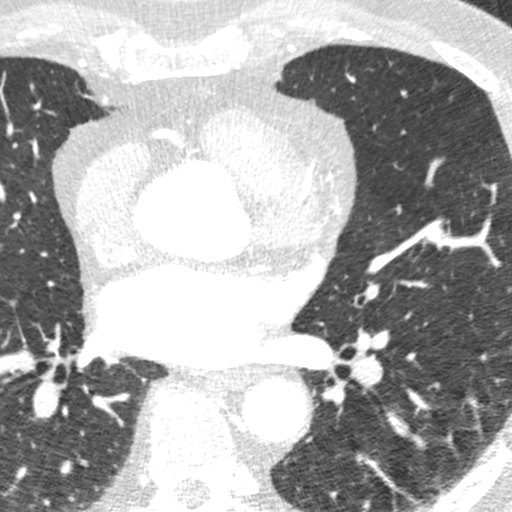

[Series 9: ts syst sharp 39 % · axial · 0.39mm/px · z∈[+1189,+1231]mm · 2 of 316 slices shown]
[im 106/316  lung]
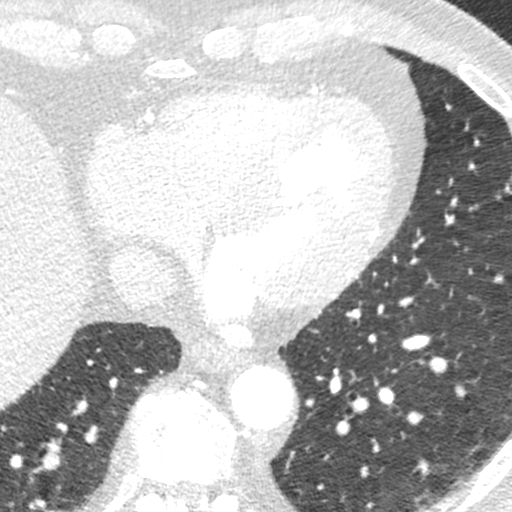
[im 211/316  lung]
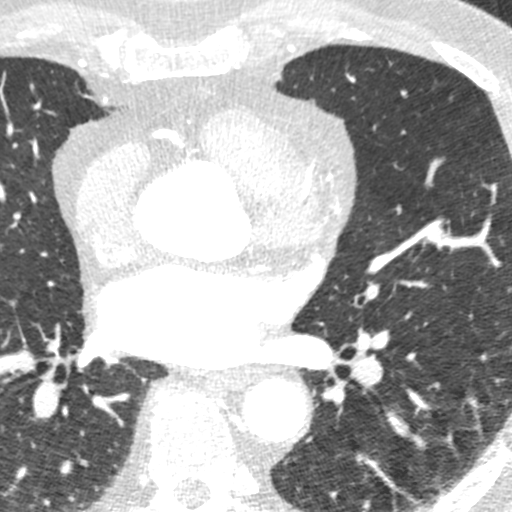

[8 of 20 positions shown; findings below may reference images not displayed]

FINDINGS: There are filling defects within segmental sized and subsegmental
sized pulmonary artery branches to the left upper lobe, compatible
with acute nonobstructive embolus. Aortic atherosclerosis. Within
the visualized portions of the thorax there are no suspicious
appearing pulmonary nodules or masses, there is no acute
consolidative airspace disease, no pleural effusions, no
pneumothorax and no lymphadenopathy. Visualized portions of the
upper abdomen are unremarkable. There are no aggressive appearing
lytic or blastic lesions noted in the visualized portions of the
skeleton.
IMPRESSION: 1. Multiple nonobstructive pulmonary emboli in the segmental and
subsegmental sized pulmonary artery branches of the left upper lobe.
2. Aortic Atherosclerosis (W09MJ-O6R.R).

These results will be called to the ordering clinician or
representative by the Radiologist Assistant, and communication
documented in the PACS or zVision Dashboard.

EXAM:
Cardiac/Coronary  CT
FINDINGS: A 120 kV prospective scan was triggered in the descending thoracic
aorta at 111 HU's. Axial non-contrast 3 mm slices were carried out
through the heart. The data set was analyzed on a dedicated work
station and scored using the Agatson method. Gantry rotation speed
was 250 msecs and collimation was .6 mm. No beta blockade and 0.8 mg
of sl NTG was given. The 3D data set was reconstructed in 5%
intervals of the 67-82 % of the R-R cycle. Diastolic phases were
analyzed on a dedicated work station using MPR, MIP and VRT modes.
The patient received 80 cc of contrast.

Aorta: Normal size. Calcifications of the aortic root. No
dissection.

Aortic Valve:  Trileaflet.  No calcifications.

Coronary Arteries:  Normal coronary origin.  Right dominance.

RCA is a large dominant artery that gives rise to PDA and PLVB.
There is minimal calcified plaque in the ostium of the RCA with
associated stenosis of 0-24%.

Left main is a large artery that gives rise to LAD, Ramus and LCX
arteries. There is minimal calcified plaque in the proximal LM with
associated stenosis of 0-24%. There is mild calcified plaque in the
distal LM with associated stenosis of 25-49%.

LAD is a large vessel that gives rise to a small D1 and D2 branches.
There is mild calcified plaque in the ostial LAD with associated
stenosis of 25-49%. There is minimal mixed plaque in the proximal
LAD with associated stenosis of 0-24%.

Ramus is a moderate sized vessel.  There is no plaque.

LCX is a non-dominant artery.  There is no plaque.

Other findings:

Normal pulmonary vein drainage into the left atrium.

Normal let atrial appendage without a thrombus.

Normal size of the pulmonary artery.
IMPRESSION: 1. Coronary calcium score of 173. This was 83rd percentile for age
and sex matched control.

2. Normal coronary origin with right dominance.

3. There is mild calcified plaque in the distal LM and ostial LAD.
CAD-RADS 1.

4. The study has been sent for FFR analysis.

5. Recommend aggressive risk factor modification with guideline
directed medical therapy.

6. Consider non cardiac causes of chest pain.

[employer]

*** End of Addendum ***
EXAM:
OVER-READ INTERPRETATION  CT CHEST

The following report is an over-read performed by radiologist Dr.
Juan C Chang [REDACTED] on 08/25/2019. This
over-read does not include interpretation of cardiac or coronary
anatomy or pathology. The coronary calcium score/coronary CTA
interpretation by the cardiologist is attached.
FINDINGS: There are filling defects within segmental sized and subsegmental
sized pulmonary artery branches to the left upper lobe, compatible
with acute nonobstructive embolus. Aortic atherosclerosis. Within
the visualized portions of the thorax there are no suspicious
appearing pulmonary nodules or masses, there is no acute
consolidative airspace disease, no pleural effusions, no
pneumothorax and no lymphadenopathy. Visualized portions of the
upper abdomen are unremarkable. There are no aggressive appearing
lytic or blastic lesions noted in the visualized portions of the
skeleton.
IMPRESSION: 1. Multiple nonobstructive pulmonary emboli in the segmental and
subsegmental sized pulmonary artery branches of the left upper lobe.
2. Aortic Atherosclerosis (W09MJ-O6R.R).

These results will be called to the ordering clinician or
representative by the Radiologist Assistant, and communication
documented in the PACS or zVision Dashboard.

## 2021-11-01 ENCOUNTER — Other Ambulatory Visit: Payer: Self-pay

## 2021-11-01 ENCOUNTER — Ambulatory Visit: Payer: Medicare Other | Admitting: Cardiology

## 2021-11-01 ENCOUNTER — Encounter: Payer: Self-pay | Admitting: Cardiology

## 2021-11-01 VITALS — BP 140/88 | HR 91 | Ht 70.0 in | Wt 206.0 lb

## 2021-11-01 DIAGNOSIS — R002 Palpitations: Secondary | ICD-10-CM

## 2021-11-01 DIAGNOSIS — R079 Chest pain, unspecified: Secondary | ICD-10-CM | POA: Diagnosis not present

## 2021-11-01 DIAGNOSIS — I200 Unstable angina: Secondary | ICD-10-CM

## 2021-11-01 DIAGNOSIS — E7800 Pure hypercholesterolemia, unspecified: Secondary | ICD-10-CM | POA: Diagnosis not present

## 2021-11-01 DIAGNOSIS — I2510 Atherosclerotic heart disease of native coronary artery without angina pectoris: Secondary | ICD-10-CM

## 2021-11-01 DIAGNOSIS — I10 Essential (primary) hypertension: Secondary | ICD-10-CM | POA: Diagnosis not present

## 2021-11-01 DIAGNOSIS — G4733 Obstructive sleep apnea (adult) (pediatric): Secondary | ICD-10-CM

## 2021-11-01 LAB — LIPID PANEL
Chol/HDL Ratio: 2.8 ratio (ref 0.0–5.0)
Cholesterol, Total: 154 mg/dL (ref 100–199)
HDL: 55 mg/dL (ref >39)
LDL Chol Calc (NIH): 79 mg/dL (ref 0–99)
Triglycerides: 114 mg/dL (ref 0–149)
VLDL Cholesterol Cal: 20 mg/dL (ref 5–40)

## 2021-11-01 LAB — ALT: ALT: 35 IU/L (ref 0–44)

## 2021-11-01 NOTE — Addendum Note (Signed)
Addended by: Gaetano Net on: 11/01/2021 09:10 AM   Modules accepted: Orders

## 2021-11-01 NOTE — Patient Instructions (Addendum)
Medication Instructions:  Your physician recommends that you continue on your current medications as directed. Please refer to the Current Medication list given to you today.  *If you need a refill on your cardiac medications before your next appointment, please call your pharmacy*   Lab Work: TODAY:  LIPID & ALT  If you have labs (blood work) drawn today and your tests are completely normal, you will receive your results only by: [place] (if you have MyChart) OR A paper copy in the mail If you have any lab test that is abnormal or we need to change your treatment, we will call you to review the results.   Testing/Procedures: Your physician has requested that you have a lexiscan myoview. For further information please visit [email or web address]. Please follow instruction sheet, BELOW:  You are scheduled for a Myocardial Perfusion Imaging Study Please arrive 15 minutes prior to your appointment time for registration and insurance purposes.  The test will take approximately 3 to 4 hours to complete; you may bring reading material.  If someone comes with you to your appointment, they will need to remain in the main lobby due to limited space in the testing area. **If you are pregnant or breastfeeding, please notify the nuclear lab prior to your appointment**  How to prepare for your Myocardial Perfusion Test: Do not eat or drink 3 hours prior to your test, except you may have water. Do not consume products containing caffeine (regular or decaffeinated) 12 hours prior to your test. (ex: coffee, chocolate, sodas, tea). Do bring a list of your current medications with you.  If not listed below, you may take your medications as normal. Do wear comfortable clothes (no dresses or overalls) and walking shoes, tennis shoes preferred (No heels or open toe shoes are allowed). Do NOT wear cologne, perfume, aftershave, or lotions (deodorant is allowed). If these instructions are not followed,  your test will have to be rescheduled.    Follow-Up: At [facility], you and your health needs are our priority.  As part of our continuing mission to provide you with exceptional heart care, we have created designated Provider Care Teams.  These Care Teams include your primary Cardiologist (physician) and Advanced Practice Providers (APPs -  Physician Assistants and Nurse Practitioners) who all work together to provide you with the care you need, when you need it.  We recommend signing up for the patient portal called "MyChart".  Sign up information is provided on this After Visit Summary.  MyChart is used to connect with patients for Virtual Visits (Telemedicine).  Patients are able to view lab/test results, encounter notes, upcoming appointments, etc.  Non-urgent messages can be sent to your provider as well.   To learn more about what you can do with MyChart, go to [email or web address].    Your next appointment:   12 month(s)  The format for your next appointment:   In Person  Provider:   Fransico Him, MD    Other Instructions

## 2021-11-01 NOTE — Progress Notes (Signed)
Date:  11/01/2021   ID:  Corey Gill, DOB 1950/11/19, MRN [id number]  PCP:  Kristen Loader, FNP  Cardiologist:  Fransico Him, MD  Electrophysiologist:  None   Chief Complaint:  OSA  History of Present Illness:    Corey Gill is a 71 y.o. male with a hx of OSA intolerant to CPAP, HTN, non-obstructive CAD (minimal CAD in RCA 0-24%, 25-49% distal LM and 25-49% in the ostial LAD by coronary CTA with normal FFR), HLD and obesity. He has been followed by Dr. Lamonte Sakai for multiple subsegmental PE's and was referred to Hematology, Dr. Irene Limbo, and was found to be positive for Lupus anticoagulant.    He is here today for followup and is doing well.  He occasionally has some chest discomfort about once monthly off and on for a day.  He describes it as heaviness with no radiation and tightness.  There is no nausea or diaphoresis with the pain but does get SOB with the tightness.Marland Kitchen  He denies any DOE unless he has CP.  He denies any PND, orthopnea, LE edema, dizziness or syncope.  He says that his heart will race some when he exerts himself and will get dizzy on occasion.  He is compliant with his meds and is tolerating meds with no SE.     Prior CV studies:   The following studies were reviewed today:  EKG  Past Medical History:  Diagnosis Date   Benign fasciculation-cramp syndrome 03/28/2013   CAD (coronary artery disease), native coronary artery    minimal CAD in RCA 0-24%, 25-49% distal LM and 25-49% in the ostial LAD by coronary CTA with normal FFR   Diabetes mellitus without complication (HCC)    Diverticulosis    Essential hypertension    GERD (gastroesophageal reflux disease)    Glaucoma    Bilateral   Headache    History of colonoscopy    HLD (hyperlipidemia)    OSA on CPAP    Psoriasis    Pulmonary emboli (HCC)    positive lupus anticoagulant now on Xarelto   Past Surgical History:  Procedure Laterality Date   APPENDECTOMY     COLON SURGERY  2007   Due to tumor, benign    HERNIA REPAIR  2006   Right inguinal   SPINE SURGERY  1982   Due to whiplash injury, cervical     Current Meds  Medication Sig   amLODipine (NORVASC) 5 MG tablet TAKE 1 TABLET(5 MG) BY MOUTH DAILY   augmented betamethasone dipropionate (DIPROLENE-AF) 0.05 % cream Apply 1 drop topically 2 (two) times daily.   dorzolamide (TRUSOPT) 2 % ophthalmic solution Place 1 drop into both eyes 2 (two) times daily.    glimepiride (AMARYL) 1 MG tablet Take 1 mg by mouth daily.   halobetasol (ULTRAVATE) 0.05 % ointment 1 application   mometasone (ELOCON) 0.1 % lotion 2-3 drop to affected area   mometasone (ELOCON) 0.1 % ointment Apply 1 application topically daily.   omeprazole (PRILOSEC) 20 MG capsule Take 20 mg by mouth daily.   pravastatin (PRAVACHOL) 80 MG tablet TAKE 1 TABLET(80 MG) BY MOUTH EVERY EVENING   rivaroxaban (XARELTO) 20 MG TABS tablet TAKE 1 TABLET(20 MG) BY MOUTH DAILY WITH SUPPER   timolol (BETIMOL) 0.5 % ophthalmic solution Place 2 drops into both eyes daily.   tobramycin-dexamethasone (TOBRADEX) ophthalmic ointment 1 application   triamcinolone (KENALOG) 0.025 % cream Apply topically 2 (two) times daily.   triamcinolone (KENALOG) 0.240 % cream 1 application  Allergies:   Rosuvastatin, Rosuvastatin calcium, Asa [aspirin], Latanoprost, Alphagan  [brimonidine], Lumigan [bimatoprost], and Mushroom extract complex   Social History   Tobacco Use   Smoking status: Former    Packs/day: 0.50    Years: 17.00    Pack years: 8.50    Types: Cigarettes    Start date: 10/13/1976    Quit date: 09/15/1993    Years since quitting: 28.1   Smokeless tobacco: Never  Vaping Use   Vaping Use: Never used  Substance Use Topics   Alcohol use: No   Drug use: No     Family Hx: The patient's family history includes Angina in his sister; Colon polyps in his mother; GER disease in his mother; Parkinson's disease in his father; Prostate cancer in his brother and father; Skin cancer in his  mother.  ROS:   Please see the history of present illness.     All other systems reviewed and are negative.   Labs/Other Tests and Data Reviewed:    Recent Labs: 01/02/2021: ALT 23   Recent Lipid Panel Lab Results  Component Value Date/Time   CHOL 158 01/02/2021 07:52 AM   TRIG 126 01/02/2021 07:52 AM   HDL 66 01/02/2021 07:52 AM   CHOLHDL 2.4 01/02/2021 07:52 AM   CHOLHDL 3.1 08/25/2019 05:00 PM   LDLCALC 70 01/02/2021 07:52 AM    Wt Readings from Last 3 Encounters:  11/01/21 206 lb (93.4 kg)  11/06/20 202 lb (91.6 kg)  10/08/20 206 lb 9.6 oz (93.7 kg)    Objective:    Vital Signs:  BP 140/88    Pulse 91    Ht 5\' 10"  (1.778 m)    Wt 206 lb (93.4 kg)    SpO2 95%    BMI 29.56 kg/m    GEN: Well nourished, well developed in no acute distress HEENT: Normal NECK: No JVD; No carotid bruits LYMPHATICS: No lymphadenopathy CARDIAC:RRR, no murmurs, rubs, gallops RESPIRATORY:  Clear to auscultation without rales, wheezing or rhonchi  ABDOMEN: Soft, non-tender, non-distended MUSCULOSKELETAL:  No edema; No deformity  SKIN: Warm and dry NEUROLOGIC:  Alert and oriented x 3 PSYCHIATRIC:  Normal affect    EKG was performed in the office today and showed NSR with no ST changes ASSESSMENT & PLAN:    OSA - intolerant to CPAP  HTN -His BP is well controlled on exam today. -He will continue prescription drug management with amlodipine 5 mg daily with as needed refills  Non obstructive ASCAD -minimal CAD in RCA 0-24%, 25-49% distal LM and 25-49% in the ostial LAD  -he recently has been having some chest tightness and heaviness at times associated with SOB -I am going to get a Lexiscan myoview to rule out ishemia -Shared Decision Making/Informed Consent The risks [chest pain, shortness of breath, cardiac arrhythmias, dizziness, blood pressure fluctuations, myocardial infarction, stroke/transient ischemic attack, nausea, vomiting, allergic reaction, radiation exposure, metallic  taste sensation and life-threatening complications (estimated to be 1 in 10,000)], benefits (risk stratification, diagnosing coronary artery disease, treatment guidance) and alternatives of a nuclear stress test were discussed in detail with Corey Gill and he agrees to proceed. -He will continue statin therapy -No aspirin due to DOAC use  HLD -LDL goal < 70 -Check FLP and ALT -Continue prescription drug management with pravastatin 80 mg daily with as needed refills  Palpitations -He has a history of palpitations in the past but only occurs now when he goes up the stairs sometimes.  -he has OSA but is  intolerant to PAP -Event monitor showed no arrhythmias   Medication Adjustments/Labs and Tests Ordered: Current medicines are reviewed at length with the patient today.  Concerns regarding medicines are outlined above.  Tests Ordered: No orders of the defined types were placed in this encounter.  Medication Changes: No orders of the defined types were placed in this encounter.   Disposition:  Follow up in 1 year(s)  Signed, Fransico Him, MD  11/01/2021 8:58 AM    [facility]

## 2021-11-01 NOTE — Addendum Note (Signed)
Addended by: Gaetano Net on: 11/01/2021 02:01 PM   Modules accepted: Orders

## 2021-11-01 NOTE — Addendum Note (Signed)
Addended by: Fransico Him R on: 11/01/2021 02:10 PM   Modules accepted: Orders

## 2021-11-04 ENCOUNTER — Telehealth (HOSPITAL_COMMUNITY): Payer: Self-pay | Admitting: *Deleted

## 2021-11-04 ENCOUNTER — Encounter (HOSPITAL_COMMUNITY): Payer: Self-pay | Admitting: *Deleted

## 2021-11-04 NOTE — Addendum Note (Signed)
Addended by: Janan Halter F on: 11/04/2021 02:32 PM   Modules accepted: Orders

## 2021-11-04 NOTE — Telephone Encounter (Signed)
My Chart letter sent outlining instructions for stress test on 11/11/21.

## 2021-11-05 ENCOUNTER — Telehealth: Payer: Self-pay

## 2021-11-05 ENCOUNTER — Telehealth: Payer: Self-pay | Admitting: Cardiology

## 2021-11-05 DIAGNOSIS — E7800 Pure hypercholesterolemia, unspecified: Secondary | ICD-10-CM

## 2021-11-05 MED ORDER — EZETIMIBE 10 MG PO TABS: 10.0000 mg | ORAL_TABLET | Freq: Every day | ORAL | 3 refills | Status: DC

## 2021-11-05 NOTE — Telephone Encounter (Signed)
Patient returning call for lab results. 

## 2021-11-05 NOTE — Telephone Encounter (Signed)
The patient has been notified of the result and verbalized understanding.  All questions (if any) were answered. Antonieta Iba, RN 11/05/2021 3:49 PM  Rx has been sent in. Labs have been scheduled.

## 2021-11-05 NOTE — Telephone Encounter (Signed)
See previous note

## 2021-11-05 NOTE — Telephone Encounter (Signed)
-----   Message from Leeroy Bock, RPH-CPP sent at 11/05/2021  8:12 AM EST ----- Pt on pravastatin 80mg  daily with prior intolerance to rosuvastatin. Recommend adding ezetimibe 10mg  daily and recheck labs in 2 months.

## 2021-11-11 ENCOUNTER — Ambulatory Visit (HOSPITAL_COMMUNITY): Payer: Medicare Other | Attending: Cardiology

## 2021-11-11 ENCOUNTER — Other Ambulatory Visit: Payer: Self-pay

## 2021-11-11 DIAGNOSIS — I200 Unstable angina: Secondary | ICD-10-CM | POA: Diagnosis not present

## 2021-11-11 LAB — MYOCARDIAL PERFUSION IMAGING
Base ST Depression (mm): 0 mm
LV dias vol: 74 mL (ref 62–150)
LV sys vol: 29 mL
Nuc Stress EF: 60 %
Peak HR: 96 {beats}/min
Rest HR: 58 {beats}/min
Rest Nuclear Isotope Dose: 8.9 mCi
SDS: 4
SRS: 0
SSS: 4
ST Depression (mm): 0 mm
Stress Nuclear Isotope Dose: 30.4 mCi
TID: 1

## 2021-11-11 MED ORDER — REGADENOSON 0.4 MG/5ML IV SOLN
0.4000 mg | Freq: Once | INTRAVENOUS | Status: AC
  Administered 2021-11-11: 0.4 mg via INTRAVENOUS

## 2021-11-11 MED ORDER — TECHNETIUM TC 99M TETROFOSMIN IV KIT
30.4000 | PACK | Freq: Once | INTRAVENOUS | Status: AC | PRN
  Administered 2021-11-11: 30.4 via INTRAVENOUS
  Filled 2021-11-11: qty 31

## 2021-11-11 MED ORDER — TECHNETIUM TC 99M TETROFOSMIN IV KIT
8.9000 | PACK | Freq: Once | INTRAVENOUS | Status: AC | PRN
  Administered 2021-11-11: 8.9 via INTRAVENOUS
  Filled 2021-11-11: qty 9

## 2021-11-19 DIAGNOSIS — H401123 Primary open-angle glaucoma, left eye, severe stage: Secondary | ICD-10-CM | POA: Diagnosis not present

## 2021-11-19 DIAGNOSIS — H524 Presbyopia: Secondary | ICD-10-CM | POA: Diagnosis not present

## 2021-11-22 ENCOUNTER — Telehealth: Payer: Self-pay

## 2021-11-22 MED ORDER — ISOSORBIDE MONONITRATE ER 30 MG PO TB24: 30.0000 mg | ORAL_TABLET | Freq: Every day | ORAL | 3 refills | Status: DC

## 2021-11-22 NOTE — Telephone Encounter (Signed)
The patient has been notified of the result and verbalized understanding.  All questions (if any) were answered. ?Antonieta Iba, RN 11/22/2021 9:24 AM  ?Rx has been sent in. Follow up appointment scheduled.  ?

## 2021-11-22 NOTE — Telephone Encounter (Signed)
-----   Message from Sueanne Margarita, MD sent at 11/18/2021  1:50 PM EST ----- ?Nuclear stress test shows a very minimal defect in the apical inferior wall that could be related to variations in diaphragmatic attenuation.  This is very low risk scan.  Recommend starting Imdur 30 mg daily please encourage patient to avoid any erectile dysfunction drugs while taking Imdur.  Please have him follow-up with extender in 4 weeks to see how he is doing ?

## 2021-12-16 DIAGNOSIS — Z8042 Family history of malignant neoplasm of prostate: Secondary | ICD-10-CM | POA: Diagnosis not present

## 2021-12-16 DIAGNOSIS — E1169 Type 2 diabetes mellitus with other specified complication: Secondary | ICD-10-CM | POA: Diagnosis not present

## 2021-12-16 DIAGNOSIS — E539 Vitamin B deficiency, unspecified: Secondary | ICD-10-CM | POA: Diagnosis not present

## 2021-12-16 DIAGNOSIS — Z125 Encounter for screening for malignant neoplasm of prostate: Secondary | ICD-10-CM | POA: Diagnosis not present

## 2021-12-16 DIAGNOSIS — Z1211 Encounter for screening for malignant neoplasm of colon: Secondary | ICD-10-CM | POA: Diagnosis not present

## 2021-12-16 DIAGNOSIS — I10 Essential (primary) hypertension: Secondary | ICD-10-CM | POA: Diagnosis not present

## 2021-12-16 DIAGNOSIS — Z0000 Encounter for general adult medical examination without abnormal findings: Secondary | ICD-10-CM | POA: Diagnosis not present

## 2021-12-23 NOTE — Progress Notes (Signed)
?Cardiology Office Note:   ? ?Date:  12/30/2021  ? ?ID:  Corey Gill, DOB Dec 13, 1950, MRN [id number] ? ?PCP:  Kristen Loader, FNP ?  ?[facility] Providers ?Cardiologist:  Fransico Him, MD    ? ?Referring MD: Kristen Loader, FNP  ? ?Chief Complaint: follow-up chest discomfort ? ?History of Present Illness:   ? ?Corey Gill is a 71 y.o. male with a hx of non-obstructive CAD by coronary CTA with normal FFR 08/2019 , hypertension, PE found to be positive for Lupus anticoagulant on chronic anticoagulation, diabetes, and OSA intolerant to CPAP. ? ?He has been followed consistently by Dr. Radford Pax and at office visit 06/28/19, he reported more shortness of breath. Coronary CTA revealed ostial LAD with 25-49% stenosis, proximal LAD with 0-24% stenosis, proximal LM with 0-24% and distal LM with 25-49% stenosis, calcium score of 173, 83rd percentile for age and sex matched control, normal coronary origin with right dominance, mild calcified plaque in the distal LM and ostial LAD with FFR revealing no significant flow limiting lesions.  ? ?At office visit on 11/01/21, he reported occasional chest discomfort intermittent for the previous one month. Nuclear stress test 11/11/21 revealed a very minimal defect in apical inferior wall that could be related to variations in diaphragmatic attenuation, low risk scan. He was advised to start Imdur 30 mg daily by Dr. Radford Pax and to follow-up in 4 weeks. ? ?Today, he is here for follow-up.  Started Imdur 30 mg as advised on 11/22/2021 and had pounding headache for 1 week, nausea, and felt terrible. He switched to taking Imdur in the evening.  He reports he is tolerating better with headache only when he gets up to urinate, is able to go back to sleep.  When he wakes up, he feels very sluggish and has slight headache which subsides at about 10 am.  Reports chest discomfort has completely resolved since starting Imdur.  He started Zetia 10 mg daily in addition to pravastatin for LDL  cholesterol greater than 70 in February, no concerns. Scheduled for recheck of lipids/ALT today.  He is active at home doing yard work and fishing on a regular basis. He denies chest pain, shortness of breath, lower extremity edema, fatigue, palpitations, melena, hematuria, hemoptysis, diaphoresis, weakness, presyncope, syncope, orthopnea, and PND. ? ? ?Past Medical History:  ?Diagnosis Date  ? Benign fasciculation-cramp syndrome 03/28/2013  ? CAD (coronary artery disease), native coronary artery   ? minimal CAD in RCA 0-24%, 25-49% distal LM and 25-49% in the ostial LAD by coronary CTA with normal FFR  ? Diabetes mellitus without complication ([place])   ? Diverticulosis   ? Essential hypertension   ? GERD (gastroesophageal reflux disease)   ? Glaucoma   ? Bilateral  ? Headache   ? History of colonoscopy   ? HLD (hyperlipidemia)   ? OSA on CPAP   ? Psoriasis   ? Pulmonary emboli ([place])   ? positive lupus anticoagulant now on Xarelto  ? ? ?Past Surgical History:  ?Procedure Laterality Date  ? APPENDECTOMY    ? COLON SURGERY  2007  ? Due to tumor, benign  ? HERNIA REPAIR  2006  ? Right inguinal  ? [place]  ? Due to whiplash injury, cervical  ? ? ?Current Medications: ?Current Meds  ?Medication Sig  ? amLODipine (NORVASC) 5 MG tablet TAKE 1 TABLET(5 MG) BY MOUTH DAILY  ? augmented betamethasone dipropionate (DIPROLENE-AF) 0.05 % cream Apply 1 drop topically 2 (two) times daily.  ?  dorzolamide (TRUSOPT) 2 % ophthalmic solution Place 1 drop into both eyes 2 (two) times daily.   ? ezetimibe (ZETIA) 10 MG tablet Take 1 tablet (10 mg total) by mouth daily.  ? glimepiride (AMARYL) 2 MG tablet Take 2 mg by mouth daily with breakfast.  ? halobetasol (ULTRAVATE) 0.05 % ointment 1 application  ? isosorbide mononitrate (IMDUR) 30 MG 24 hr tablet Take 0.5 tablets (15 mg total) by mouth daily.  ? mometasone (ELOCON) 0.1 % lotion 2-3 drop to affected area  ? mometasone (ELOCON) 0.1 % ointment Apply 1 application topically  daily.  ? omeprazole (PRILOSEC) 20 MG capsule Take 20 mg by mouth daily.  ? pravastatin (PRAVACHOL) 80 MG tablet TAKE 1 TABLET(80 MG) BY MOUTH EVERY EVENING  ? rivaroxaban (XARELTO) 20 MG TABS tablet TAKE 1 TABLET(20 MG) BY MOUTH DAILY WITH SUPPER  ? timolol (BETIMOL) 0.5 % ophthalmic solution Place 2 drops into both eyes daily.  ? tobramycin-dexamethasone (TOBRADEX) ophthalmic ointment 1 application  ? triamcinolone (KENALOG) 0.025 % cream Apply topically 2 (two) times daily.  ? [DISCONTINUED] glimepiride (AMARYL) 1 MG tablet Take 2 mg by mouth daily.  ? [DISCONTINUED] isosorbide mononitrate (IMDUR) 30 MG 24 hr tablet Take 1 tablet (30 mg total) by mouth daily.  ?  ? ?Allergies:   Rosuvastatin, Rosuvastatin calcium, Aspirin, Latanoprost, Alphagan  [brimonidine], Bimatoprost, Brimonidine tartrate, Metformin hcl, and Mushroom extract complex  ? ?Social History  ? ?Socioeconomic History  ? Marital status: Married  ?  Spouse name: Not on file  ? Number of children: Not on file  ? Years of education: 6  ? Highest education level: Not on file  ?Occupational History  ? Occupation: retired  ?  Comment: benefits counselor  ?Tobacco Use  ? Smoking status: Former  ?  Packs/day: 0.50  ?  Years: 17.00  ?  Pack years: 8.50  ?  Types: Cigarettes  ?  Start date: 10/13/1976  ?  Quit date: 09/15/1993  ?  Years since quitting: 28.3  ? Smokeless tobacco: Never  ?Vaping Use  ? Vaping Use: Never used  ?Substance and Sexual Activity  ? Alcohol use: No  ? Drug use: No  ? Sexual activity: Not on file  ?Other Topics Concern  ? Not on file  ?Social History Narrative  ? Not on file  ? ?Social Determinants of Health  ? ?Financial Resource Strain: Not on file  ?Food Insecurity: Not on file  ?Transportation Needs: Not on file  ?Physical Activity: Not on file  ?Stress: Not on file  ?Social Connections: Not on file  ?  ? ?Family History: ?The patient's family history includes Angina in his sister; Colon polyps in his mother; GER disease in his  mother; Parkinson's disease in his father; Prostate cancer in his brother and father; Skin cancer in his mother. ? ?ROS:   ?Please see the history of present illness.    All other systems reviewed and are negative. ? ?Labs/Other Studies Reviewed:   ? ?The following studies were reviewed today: ? ?Lexiscan myoview 11/11/21 ? ?  No ST deviation was noted. ?  LV perfusion is abnormal. There is evidence of ischemia. There is no evidence of infarction. Defect 1: There is a small defect with mild reduction in uptake present in the apical inferior location(s) that is reversible. There is normal wall motion in the defect area. Consistent with ischemia. ?  Left ventricular function is normal. Nuclear stress EF: 60 %. The left ventricular ejection fraction is normal (55-65%).  End diastolic cavity size is normal. End systolic cavity size is normal. ?  Findings are consistent with ischemia. The study is low risk. ?  Prior study available for comparison from 07/27/2012 showing no ischemia at that time. ? ? ?Cardiac monitor 12/20/20 ? ?Predominant rhythm is normal sinus rhythm with average heart rate 69bpm and renagedf from 45 to 137bpm.  ? ? ?Coronary CTA 08/25/19 ? ?Marland Kitchen Coronary calcium score of 173. This was 83rd percentile for age ?and sex matched control. ?  ?2. Normal coronary origin with right dominance. ?  ?3. There is mild calcified plaque in the distal LM and ostial LAD. ?CAD-RADS 1. ?  ?4. The study has been sent for [facility] analysis. ?  ?5. Recommend aggressive risk factor modification with guideline ?directed medical therapy. ?  ?6. Consider non cardiac causes of chest pain. ? ?1. CT FFR analysis showed no significant flow limiting lesions.  ? ?Recent Labs: ?11/01/2021: ALT 35  ?Recent Lipid Panel ?   ?Component Value Date/Time  ? CHOL 154 11/01/2021 0919  ? TRIG 114 11/01/2021 0919  ? HDL 55 11/01/2021 0919  ? CHOLHDL 2.8 11/01/2021 0919  ? CHOLHDL 3.1 08/25/2019 1700  ? VLDL 14 08/25/2019 1700  ? [place] 79 11/01/2021 0919   ? ? ? ?Risk Assessment/Calculations:   ?  ? ?Physical Exam:   ? ?VS:  BP 124/62   Pulse 76   Ht '5\' 10"'$  (1.778 m)   Wt 211 lb 12.8 oz (96.1 kg)   SpO2 98%   BMI 30.39 kg/m?    ? ?Wt Readings from Last 3 Encoun

## 2021-12-30 ENCOUNTER — Other Ambulatory Visit: Payer: Medicare Other

## 2021-12-30 ENCOUNTER — Encounter: Payer: Self-pay | Admitting: Nurse Practitioner

## 2021-12-30 ENCOUNTER — Ambulatory Visit: Payer: Medicare Other | Admitting: Nurse Practitioner

## 2021-12-30 VITALS — BP 124/62 | HR 76 | Ht 70.0 in | Wt 211.8 lb

## 2021-12-30 DIAGNOSIS — Z7901 Long term (current) use of anticoagulants: Secondary | ICD-10-CM

## 2021-12-30 DIAGNOSIS — E669 Obesity, unspecified: Secondary | ICD-10-CM | POA: Diagnosis not present

## 2021-12-30 DIAGNOSIS — E7800 Pure hypercholesterolemia, unspecified: Secondary | ICD-10-CM | POA: Diagnosis not present

## 2021-12-30 DIAGNOSIS — R079 Chest pain, unspecified: Secondary | ICD-10-CM | POA: Diagnosis not present

## 2021-12-30 DIAGNOSIS — I10 Essential (primary) hypertension: Secondary | ICD-10-CM | POA: Diagnosis not present

## 2021-12-30 DIAGNOSIS — I2510 Atherosclerotic heart disease of native coronary artery without angina pectoris: Secondary | ICD-10-CM | POA: Diagnosis not present

## 2021-12-30 DIAGNOSIS — E785 Hyperlipidemia, unspecified: Secondary | ICD-10-CM

## 2021-12-30 DIAGNOSIS — G4733 Obstructive sleep apnea (adult) (pediatric): Secondary | ICD-10-CM

## 2021-12-30 DIAGNOSIS — Z86711 Personal history of pulmonary embolism: Secondary | ICD-10-CM

## 2021-12-30 DIAGNOSIS — E119 Type 2 diabetes mellitus without complications: Secondary | ICD-10-CM

## 2021-12-30 LAB — LIPID PANEL
Chol/HDL Ratio: 2.2 ratio (ref 0.0–5.0)
Cholesterol, Total: 117 mg/dL (ref 100–199)
HDL: 53 mg/dL (ref >39)
LDL Chol Calc (NIH): 46 mg/dL (ref 0–99)
Triglycerides: 98 mg/dL (ref 0–149)
VLDL Cholesterol Cal: 18 mg/dL (ref 5–40)

## 2021-12-30 LAB — ALT: ALT: 31 IU/L (ref 0–44)

## 2021-12-30 MED ORDER — ISOSORBIDE MONONITRATE ER 30 MG PO TB24: 15.0000 mg | ORAL_TABLET | Freq: Every day | ORAL | 3 refills | Status: DC

## 2021-12-30 NOTE — Patient Instructions (Signed)
Medication Instructions:  ?Your physician has recommended you make the following change in your medication:  ? REDUCE the Imdur to 1/2 tablet daily  ? ?*If you need a refill on your cardiac medications before your next appointment, please call your pharmacy* ? ? ?Lab Work: ?None ordered today ? ?If you have labs (blood work) drawn today and your tests are completely normal, you will receive your results only by: ?MyChart Message (if you have MyChart) OR ?A paper copy in the mail ?If you have any lab test that is abnormal or we need to change your treatment, we will call you to review the results. ? ? ?Testing/Procedures: ?None ordered ? ? ?Follow-Up: ?At [facility], you and your health needs are our priority.  As part of our continuing mission to provide you with exceptional heart care, we have created designated Provider Care Teams.  These Care Teams include your primary Cardiologist (physician) and Advanced Practice Providers (APPs -  Physician Assistants and Nurse Practitioners) who all work together to provide you with the care you need, when you need it. ? ?We recommend signing up for the patient portal called "MyChart".  Sign up information is provided on this After Visit Summary.  MyChart is used to connect with patients for Virtual Visits (Telemedicine).  Patients are able to view lab/test results, encounter notes, upcoming appointments, etc.  Non-urgent messages can be sent to your provider as well.   ?To learn more about what you can do with MyChart, go to [email or web address].   ? ?Your next appointment:   ?01/29/22 ARRIVE AT 7:45 ? ?The format for your next appointment:   ?In Person ? ?Provider:   ?Christen Bame, NP       ? ? ?Other Instructions ?Call us if you need [place] before your appointment ? ?Important Information About Sugar ? ? ? ? ? ? ?

## 2022-01-03 ENCOUNTER — Other Ambulatory Visit: Payer: Medicare Other

## 2022-02-13 IMAGING — DX DG CHEST 2V
2 series · 2 of 2 positions shown · non-contrast
Comparison: 08/30/2018

CLINICAL DATA: Chronic cough

EXAM:
CHEST - 2 VIEW

[chest pa]
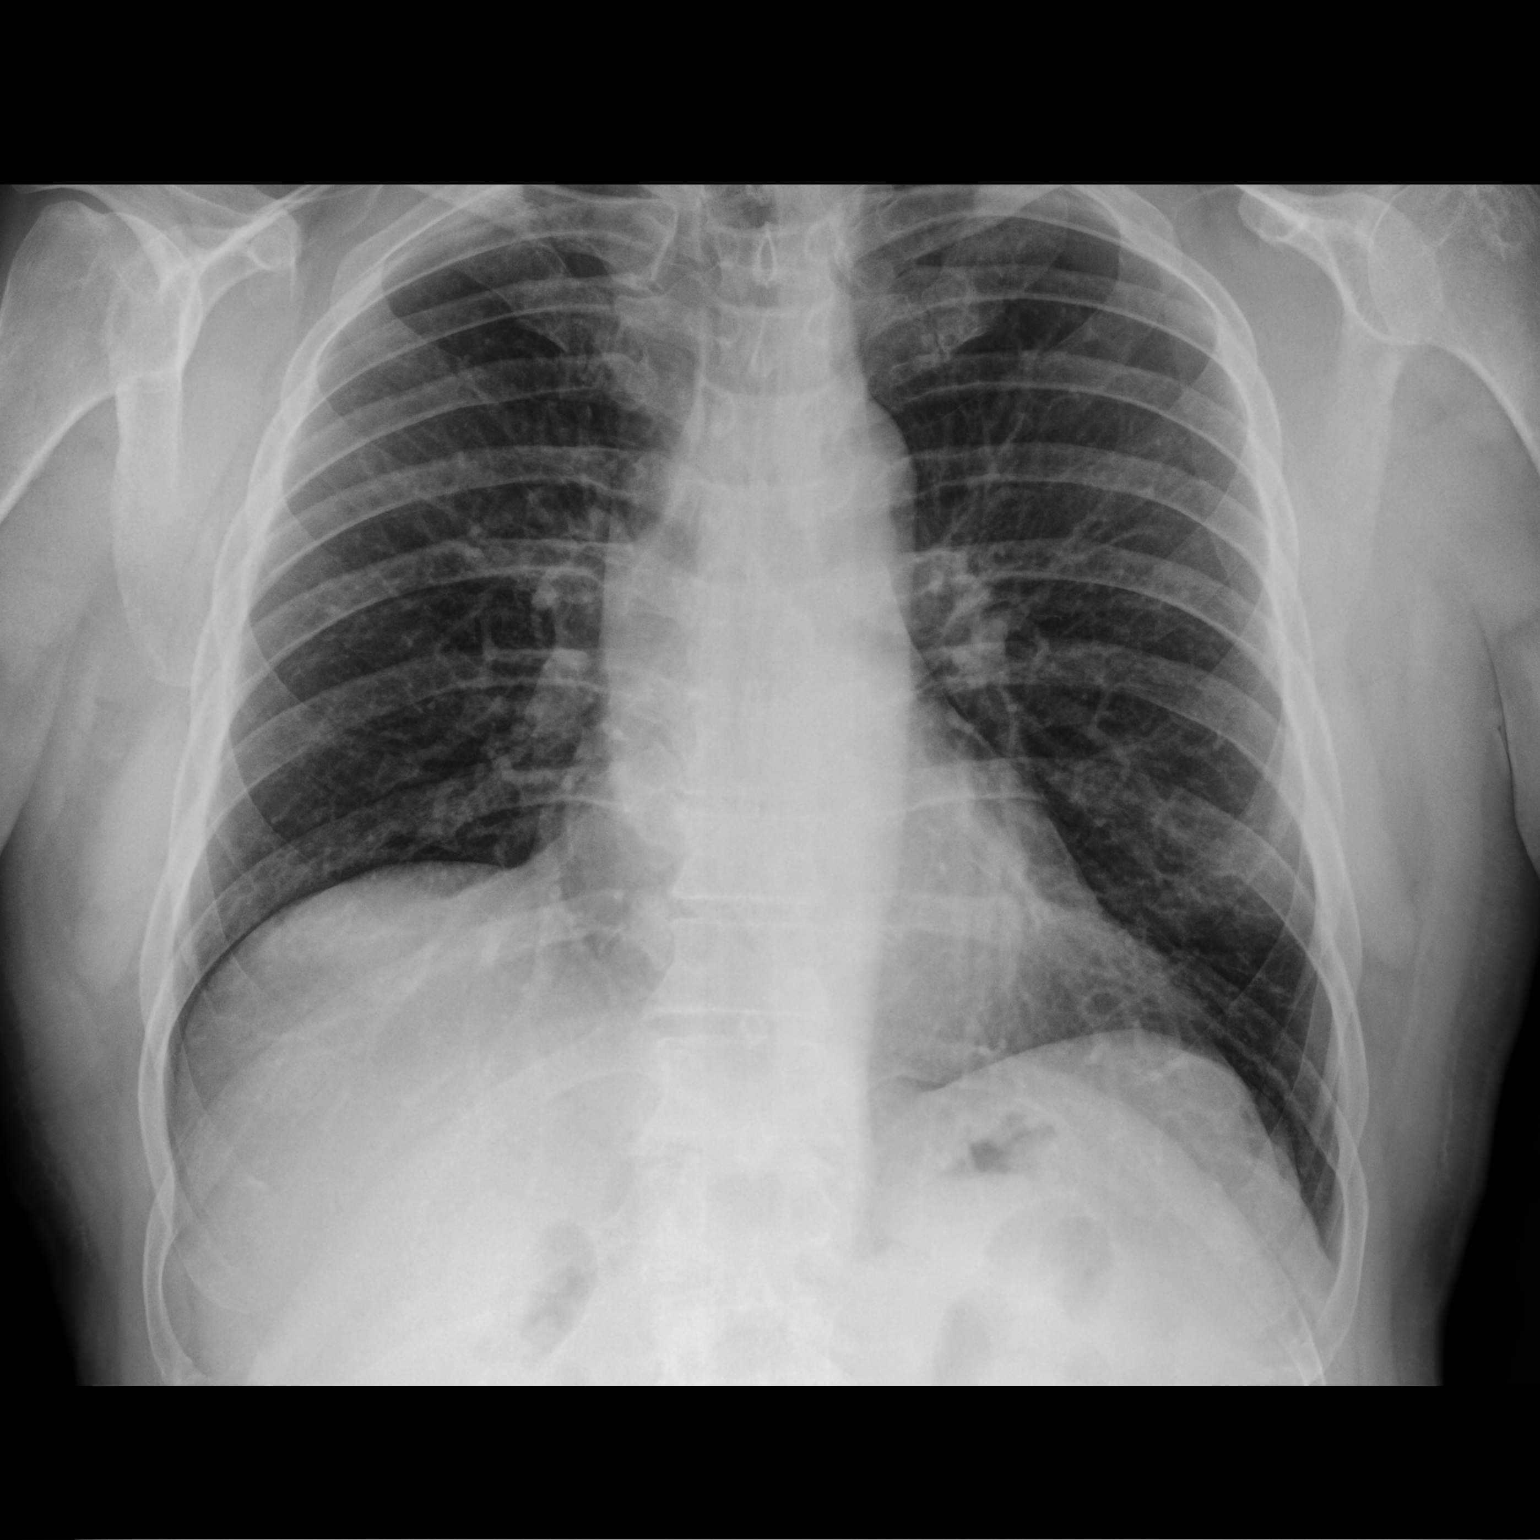

[chest lat]
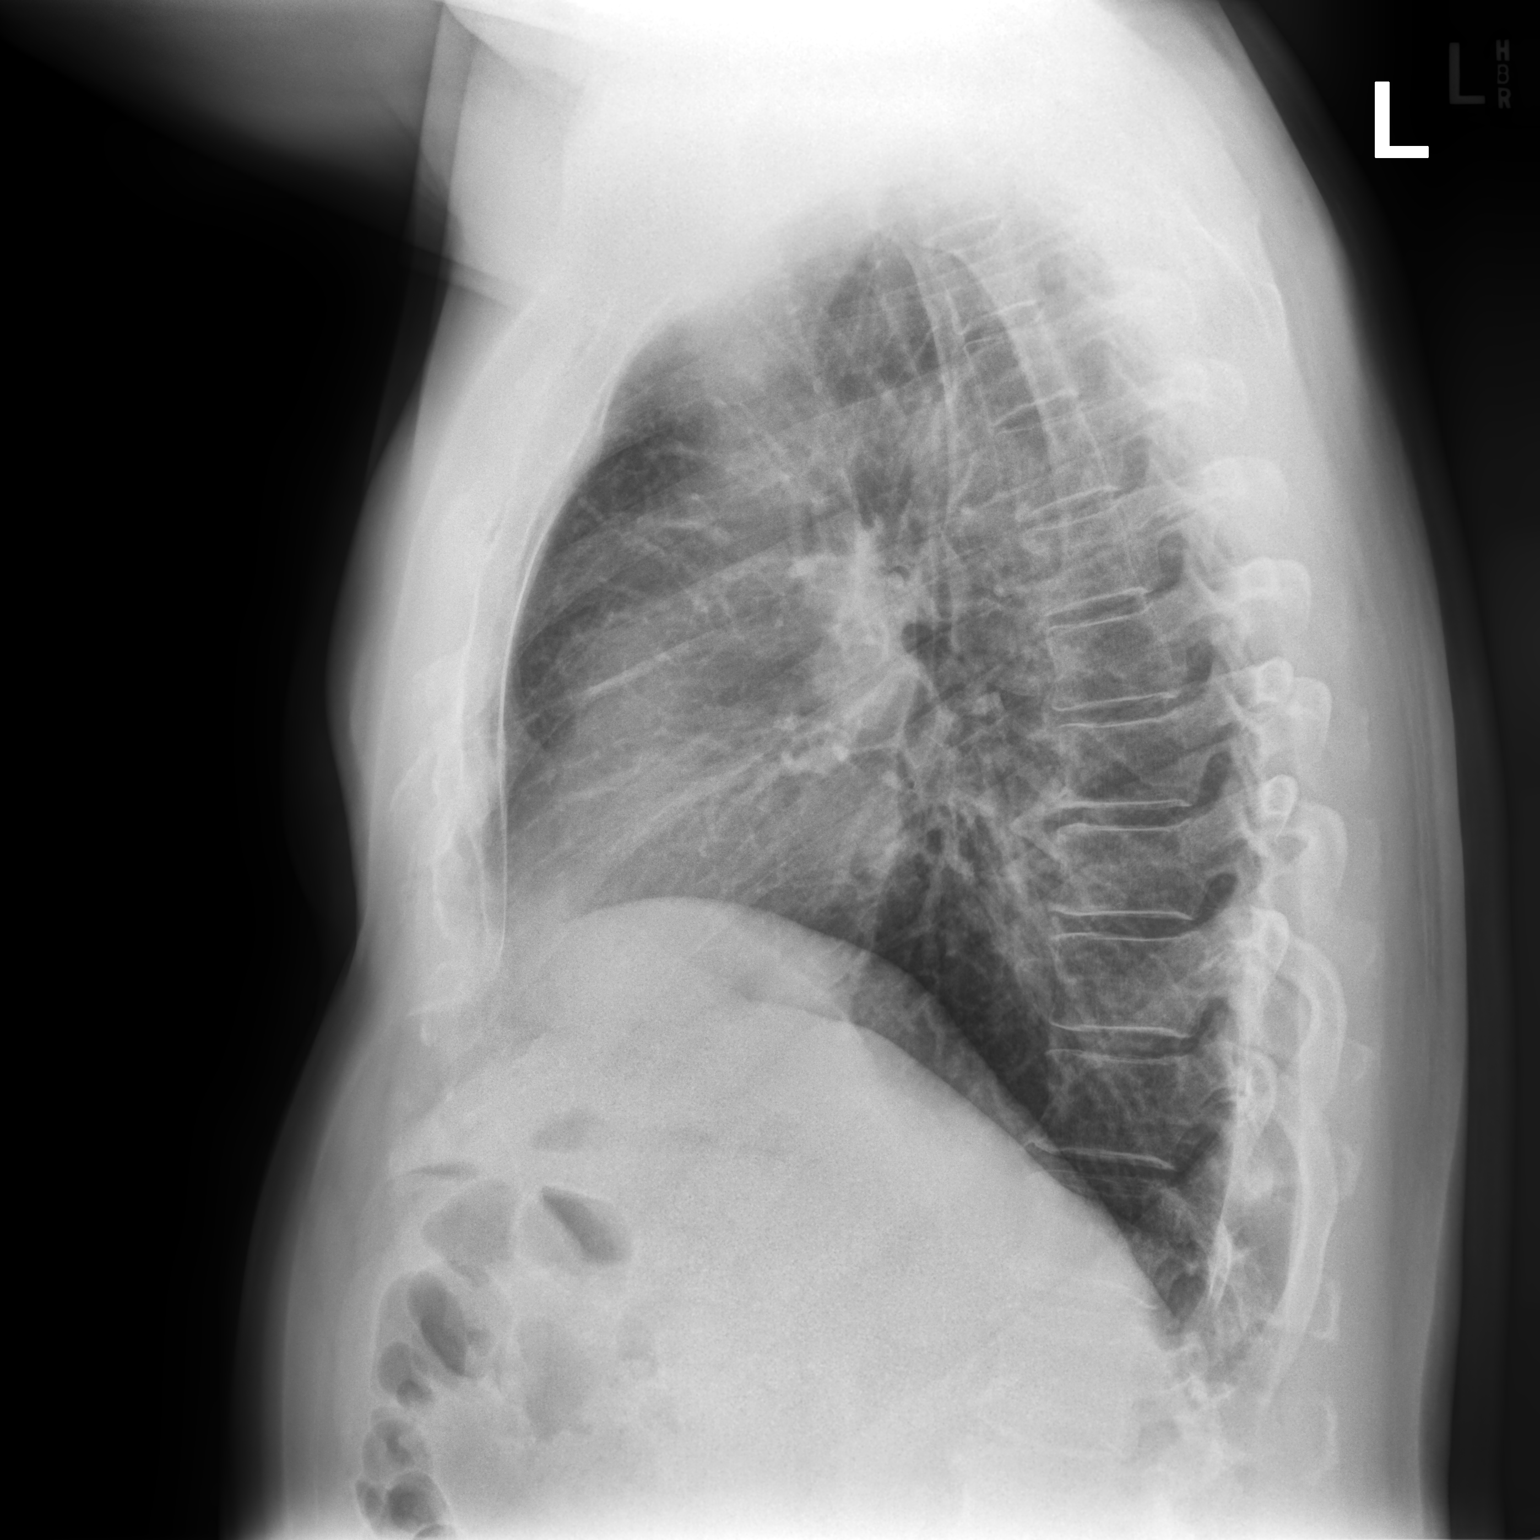

[2 of 2 positions shown; findings below may reference images not displayed]

FINDINGS: The heart size and mediastinal contours are within normal limits.
Both lungs are clear. The visualized skeletal structures are
unremarkable.
IMPRESSION: No active cardiopulmonary disease.

## 2022-02-18 DIAGNOSIS — H401123 Primary open-angle glaucoma, left eye, severe stage: Secondary | ICD-10-CM | POA: Diagnosis not present

## 2022-03-19 ENCOUNTER — Telehealth: Payer: Self-pay | Admitting: *Deleted

## 2022-03-19 DIAGNOSIS — Z7901 Long term (current) use of anticoagulants: Secondary | ICD-10-CM | POA: Diagnosis not present

## 2022-03-19 DIAGNOSIS — K219 Gastro-esophageal reflux disease without esophagitis: Secondary | ICD-10-CM | POA: Diagnosis not present

## 2022-03-19 DIAGNOSIS — Z86711 Personal history of pulmonary embolism: Secondary | ICD-10-CM | POA: Diagnosis not present

## 2022-03-19 DIAGNOSIS — Z1211 Encounter for screening for malignant neoplasm of colon: Secondary | ICD-10-CM | POA: Diagnosis not present

## 2022-03-19 NOTE — Telephone Encounter (Signed)
   Pre-operative Risk Assessment    Patient Name: Corey Gill  DOB: 06-Nov-1950 MRN: [id number]      Request for Surgical Clearance    Procedure:   COLONOSCOPY  Date of Surgery:  Clearance 06/10/22                                 Surgeon:  Dr. Therisa Doyne Surgeon's Group or Practice Name:  [facility] Phone number:  [phone] Fax number:  [phone]   Type of Clearance Requested:   - Pharmacy:  Hold Rivaroxaban (Xarelto) X'S 3 DAYS   Type of Anesthesia:   PROPOFOL   Additional requests/questions:    Astrid Divine   03/19/2022, 1:55 PM

## 2022-03-21 NOTE — Telephone Encounter (Signed)
Patient with diagnosis of unprovoked PE 08/2019 and positive lupus anticoagulant on lifelong Xarelto for anticoagulation.    Procedure: colonoscopy Date of procedure: 06/10/22  CrCl 34m/min using adjusted body weight due to obesity Platelet count 173K  Pt previously seen by pulm and heme for his PE, has not seen either since 2021. Recommend pt only hold Xarelto for 1-2 days prior to procedure and resume as soon as safely possible.    **This guidance is not considered finalized until pre-operative APP has relayed final recommendations.**

## 2022-03-21 NOTE — Telephone Encounter (Signed)
   Patient Name: Corey Gill  DOB: September 13, 1951 MRN: [id number]  Primary Cardiologist: Fransico Him, MD  Clinical pharmacists have reviewed the patient's past medical history, labs, and current medications as part of pre-operative protocol coverage.  The following recommendations have been made:  Patient with diagnosis of unprovoked PE 08/2019 and positive lupus anticoagulant on lifelong Xarelto for anticoagulation.     Procedure: colonoscopy Date of procedure: 06/10/22   CrCl 44m/min using adjusted body weight due to obesity Platelet count 173K   Pt previously seen by pulm and heme for his PE, has not seen either since 2021. Recommend pt only hold Xarelto for 1-2 days prior to procedure and resume as soon as safely possible.   I will route this recommendation to the requesting party via Epic fax function and remove from pre-op pool.  Please call with questions.  ELenna Sciara NP 03/21/2022, 2:20 PM
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
## 2022-03-31 ENCOUNTER — Ambulatory Visit: Payer: Medicare Other | Admitting: Nurse Practitioner

## 2022-03-31 ENCOUNTER — Encounter: Payer: Self-pay | Admitting: Nurse Practitioner

## 2022-03-31 VITALS — BP 136/70 | HR 68 | Ht 70.0 in | Wt 211.0 lb

## 2022-03-31 DIAGNOSIS — I2510 Atherosclerotic heart disease of native coronary artery without angina pectoris: Secondary | ICD-10-CM | POA: Diagnosis not present

## 2022-03-31 DIAGNOSIS — Z86711 Personal history of pulmonary embolism: Secondary | ICD-10-CM

## 2022-03-31 DIAGNOSIS — I10 Essential (primary) hypertension: Secondary | ICD-10-CM | POA: Diagnosis not present

## 2022-03-31 DIAGNOSIS — R051 Acute cough: Secondary | ICD-10-CM

## 2022-03-31 DIAGNOSIS — G4733 Obstructive sleep apnea (adult) (pediatric): Secondary | ICD-10-CM

## 2022-03-31 DIAGNOSIS — Z7901 Long term (current) use of anticoagulants: Secondary | ICD-10-CM

## 2022-03-31 DIAGNOSIS — E785 Hyperlipidemia, unspecified: Secondary | ICD-10-CM

## 2022-03-31 MED ORDER — POTASSIUM CHLORIDE CRYS ER 10 MEQ PO TBCR: 10.0000 meq | EXTENDED_RELEASE_TABLET | Freq: Every day | ORAL | 11 refills | Status: DC

## 2022-03-31 MED ORDER — FEXOFENADINE HCL 180 MG PO TABS: 180.0000 mg | ORAL_TABLET | Freq: Every day | ORAL | Status: AC

## 2022-03-31 MED ORDER — HYDROCHLOROTHIAZIDE 25 MG PO TABS: 25.0000 mg | ORAL_TABLET | Freq: Every day | ORAL | 11 refills | Status: DC

## 2022-03-31 NOTE — Patient Instructions (Signed)
Medication Instructions:   START K-dur one (1) tablet by mouth ( 10 mEq ) daily.  START HCTZ one (1) tablet by mouth ( 25 mg) daily.   START Allegra one (1) tablet by mouth ( 180 mg) daily.  This is OTC.   *If you need a refill on your cardiac medications before your next appointment, please call your pharmacy*   Lab Work:  Your physician recommends that you return for lab work on Tuesday, July 25. You can come in on the day of your appointment anytime between 7:30-4:30.   If you have labs (blood work) drawn today and your tests are completely normal, you will receive your results only by: [place] (if you have MyChart) OR A paper copy in the mail If you have any lab test that is abnormal or we need to change your treatment, we will call you to review the results.   Testing/Procedures:  None ordered.   Follow-Up: At [facility], you and your health needs are our priority.  As part of our continuing mission to provide you with exceptional heart care, we have created designated Provider Care Teams.  These Care Teams include your primary Cardiologist (physician) and Advanced Practice Providers (APPs -  Physician Assistants and Nurse Practitioners) who all work together to provide you with the care you need, when you need it.  We recommend signing up for the patient portal called "MyChart".  Sign up information is provided on this After Visit Summary.  MyChart is used to connect with patients for Virtual Visits (Telemedicine).  Patients are able to view lab/test results, encounter notes, upcoming appointments, etc.  Non-urgent messages can be sent to your provider as well.   To learn more about what you can do with MyChart, go to [email or web address].    Your next appointment:   6 month(s)  The format for your next appointment:   In Person  Provider:   Fransico Him, MD     Other Instructions  Your physician wants you to follow-up in: 6 months with Dr. Radford Pax.   You will receive a reminder letter in the mail two months in advance. If you don't receive a letter, please call our office to schedule the follow-up appointment.  You have been referred to Pulmonology. You should be hearing from that office.     Important Information About Sugar

## 2022-04-01 DIAGNOSIS — H401123 Primary open-angle glaucoma, left eye, severe stage: Secondary | ICD-10-CM | POA: Diagnosis not present

## 2022-04-08 ENCOUNTER — Other Ambulatory Visit: Payer: Medicare Other

## 2022-04-08 DIAGNOSIS — I2510 Atherosclerotic heart disease of native coronary artery without angina pectoris: Secondary | ICD-10-CM | POA: Diagnosis not present

## 2022-04-08 DIAGNOSIS — I10 Essential (primary) hypertension: Secondary | ICD-10-CM

## 2022-04-08 LAB — BASIC METABOLIC PANEL
BUN/Creatinine Ratio: 18 (ref 10–24)
BUN: 18 mg/dL (ref 8–27)
CO2: 25 mmol/L (ref 20–29)
Calcium: 9.6 mg/dL (ref 8.6–10.2)
Chloride: 99 mmol/L (ref 96–106)
Creatinine, Ser: 1.01 mg/dL (ref 0.76–1.27)
Glucose: 153 mg/dL — ABNORMAL HIGH (ref 70–99)
Potassium: 4.6 mmol/L (ref 3.5–5.2)
Sodium: 138 mmol/L (ref 134–144)
eGFR: 80 mL/min/{1.73_m2} (ref >59)

## 2022-04-17 ENCOUNTER — Institutional Professional Consult (permissible substitution): Payer: Medicare Other | Admitting: Pulmonary Disease

## 2022-05-06 ENCOUNTER — Ambulatory Visit: Payer: Medicare Other | Admitting: Emergency Medicine

## 2022-05-06 ENCOUNTER — Encounter: Payer: Self-pay | Admitting: Emergency Medicine

## 2022-05-06 DIAGNOSIS — R053 Chronic cough: Secondary | ICD-10-CM

## 2022-05-06 DIAGNOSIS — I2694 Multiple subsegmental pulmonary emboli without acute cor pulmonale: Secondary | ICD-10-CM

## 2022-05-06 NOTE — Progress Notes (Signed)
   Subjective:    Patient ID: Corey Gill, male    DOB: 09-29-1950, 71 y.o.   MRN: [id number]  HPI  ROV 05/08/20 --this follow-up visit for 71 year old man with a minimal smoking history, diabetes, hypertension, OSA on CPAP, psoriasis.  He was diagnosed with pulmonary embolism in December 2020.  He was treated with Xarelto, and is still on it.  Returns today for follow-up. He had a recent drug eruption, felt to be related to irbesartan.  He has seen Dr.Kale with hematology and had a hypercoagulability panel.  It looks like the only positive was a lupus anticoagulant which may have been false positive due to Xarelto.  He recommended a repeat lupus anticoagulant panel after a break from Xarelto (48 hours).  ROV 05/06/22 --71 year old man with a minimal tobacco history, diabetes, psoriasis, OSA on CPAP.  I saw him in the past for pulmonary embolism in 08/2019 for which he was treated with Xarelto with plan for lifelong due to ? Lupus anticoagulant and unprovoked clot.  He remains on this.  I changed his ACE-I to an irbesartan but he had a rash with it, had to stop it. Now on amlodipine, HCTZ. He reports that he experienced cough - has dealt with this intermittently. Cough usually non-productive, comes in spells with UA irritation. He is on omeprazole. Just started allegra, has helped his nasal congestion.   Review of Systems As per HPI     Objective:   Physical Exam  Vitals:   05/06/22 1444  BP: 128/68  Pulse: 61  Temp: 98.7 F (37.1 C)  TempSrc: Oral  SpO2: 99%  Weight: 210 lb 12.8 oz (95.6 kg)  Height: '5\' 10"'$  (1.778 m)    Gen: Pleasant, well-nourished, in no distress,  normal affect  ENT: No lesions,  mouth clear,  oropharynx clear, no postnasal drip  Neck: No JVD, no stridor  Lungs: No use of accessory muscles, no crackles or wheezing on normal respiration, no wheeze on forced expiration  Cardiovascular: RRR, heart sounds normal, no murmur or gallops, no peripheral  edema  Musculoskeletal: No deformities, no cyanosis or clubbing  Neuro: alert, awake, non focal  Skin: Warm, no lesions or rash      Assessment & Plan:  Pulmonary embolism (HCC) About a possible lupus anticoagulant.  Decision has been made for him to stay on anticoagulation lifelong as long as he can tolerate it.  He is currently tolerating Xarelto.  Chronic cough He has off and on dry cough that is consistent with upper airway irritation.  He improved after I got him off his ACE inhibitor (although he did not tolerate the ARB) and with addition of omeprazole.  Most recently he was having congestion, drainage with breakthrough cough.  He is better on Allegra which was started by Dr. Radford Pax.  Plan to continue this and the omeprazole.  Baltazar Apo, MD, PhD 05/06/2022, 3:07 PM [facility] [phone] or if no answer [phone]

## 2022-05-06 NOTE — Patient Instructions (Addendum)
Agree with continuing your Xarelto lifelong as long as you are not having any complications from this medication. Continue Allegra once daily Continue omeprazole once daily Follow with Dr. Lamonte Sakai in 12 months or sooner if you have any problems.

## 2022-05-06 NOTE — Assessment & Plan Note (Signed)
He has off and on dry cough that is consistent with upper airway irritation.  He improved after I got him off his ACE inhibitor (although he did not tolerate the ARB) and with addition of omeprazole.  Most recently he was having congestion, drainage with breakthrough cough.  He is better on Allegra which was started by Dr. Radford Pax.  Plan to continue this and the omeprazole.

## 2022-05-06 NOTE — Assessment & Plan Note (Signed)
About a possible lupus anticoagulant.  Decision has been made for him to stay on anticoagulation lifelong as long as he can tolerate it.  He is currently tolerating Xarelto.

## 2022-06-10 DIAGNOSIS — K5730 Diverticulosis of large intestine without perforation or abscess without bleeding: Secondary | ICD-10-CM | POA: Diagnosis not present

## 2022-06-10 DIAGNOSIS — D123 Benign neoplasm of transverse colon: Secondary | ICD-10-CM | POA: Diagnosis not present

## 2022-06-10 DIAGNOSIS — D125 Benign neoplasm of sigmoid colon: Secondary | ICD-10-CM | POA: Diagnosis not present

## 2022-06-10 DIAGNOSIS — Z09 Encounter for follow-up examination after completed treatment for conditions other than malignant neoplasm: Secondary | ICD-10-CM | POA: Diagnosis not present

## 2022-06-10 DIAGNOSIS — K648 Other hemorrhoids: Secondary | ICD-10-CM | POA: Diagnosis not present

## 2022-06-10 DIAGNOSIS — Z86010 Personal history of colonic polyps: Secondary | ICD-10-CM | POA: Diagnosis not present

## 2022-06-12 DIAGNOSIS — D125 Benign neoplasm of sigmoid colon: Secondary | ICD-10-CM | POA: Diagnosis not present

## 2022-06-12 DIAGNOSIS — D123 Benign neoplasm of transverse colon: Secondary | ICD-10-CM | POA: Diagnosis not present

## 2022-06-19 DIAGNOSIS — E1169 Type 2 diabetes mellitus with other specified complication: Secondary | ICD-10-CM | POA: Diagnosis not present

## 2022-06-19 DIAGNOSIS — I10 Essential (primary) hypertension: Secondary | ICD-10-CM | POA: Diagnosis not present

## 2022-07-01 DIAGNOSIS — H401123 Primary open-angle glaucoma, left eye, severe stage: Secondary | ICD-10-CM | POA: Diagnosis not present

## 2022-09-30 ENCOUNTER — Telehealth: Payer: Self-pay | Admitting: Cardiology

## 2022-09-30 DIAGNOSIS — D225 Melanocytic nevi of trunk: Secondary | ICD-10-CM | POA: Diagnosis not present

## 2022-09-30 DIAGNOSIS — L578 Other skin changes due to chronic exposure to nonionizing radiation: Secondary | ICD-10-CM | POA: Diagnosis not present

## 2022-09-30 DIAGNOSIS — L821 Other seborrheic keratosis: Secondary | ICD-10-CM | POA: Diagnosis not present

## 2022-09-30 DIAGNOSIS — L814 Other melanin hyperpigmentation: Secondary | ICD-10-CM | POA: Diagnosis not present
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
## 2022-09-30 NOTE — Telephone Encounter (Signed)
Spoke with pt who complains of intermittent chest discomfort/tightness over the past 2 months.  Pt also reports increasing SOB on exertion.  BP runs 125-140/72-80.  Pt states he is taking medications as prescribed.   Pt denies active CP or SOB.  Appointment scheduled with M.Swinyer, NP on 10/02/2022 at 850am for further evaluation.  Reviewed ED precautions.  Pt verbalizes understanding and agrees with current plan.

## 2022-09-30 NOTE — Telephone Encounter (Signed)
Pt c/o of Chest Pain: STAT if CP now or developed within 24 hours  1. Are you having CP right now? No   2. Are you experiencing any other symptoms (ex. SOB, nausea, vomiting, sweating)? Occasional tiredness, SOB on exertion   3. How long have you been experiencing CP? about 2 months   4. Is your CP continuous or coming and going? Coming and going   5. Have you taken Nitroglycerin? No  ?

## 2022-10-02 ENCOUNTER — Encounter: Payer: Self-pay | Admitting: Nurse Practitioner

## 2022-10-02 ENCOUNTER — Ambulatory Visit: Payer: Medicare Other | Attending: Nurse Practitioner | Admitting: Nurse Practitioner

## 2022-10-02 VITALS — BP 118/74 | HR 75 | Ht 70.0 in | Wt 208.0 lb

## 2022-10-02 DIAGNOSIS — G4733 Obstructive sleep apnea (adult) (pediatric): Secondary | ICD-10-CM

## 2022-10-02 DIAGNOSIS — I10 Essential (primary) hypertension: Secondary | ICD-10-CM

## 2022-10-02 DIAGNOSIS — I2510 Atherosclerotic heart disease of native coronary artery without angina pectoris: Secondary | ICD-10-CM

## 2022-10-02 DIAGNOSIS — E785 Hyperlipidemia, unspecified: Secondary | ICD-10-CM

## 2022-10-02 DIAGNOSIS — I2694 Multiple subsegmental pulmonary emboli without acute cor pulmonale: Secondary | ICD-10-CM

## 2022-10-02 DIAGNOSIS — R0609 Other forms of dyspnea: Secondary | ICD-10-CM | POA: Diagnosis not present

## 2022-10-02 DIAGNOSIS — R079 Chest pain, unspecified: Secondary | ICD-10-CM

## 2022-10-02 LAB — CBC

## 2022-10-02 NOTE — Patient Instructions (Signed)
Medication Instructions:   Your physician recommends that you continue on your current medications as directed. Please refer to the Current Medication list given to you today.   *If you need a refill on your cardiac medications before your next appointment, please call your pharmacy*   Lab Work:  TODAY!!!! CMET/CBC/LIPID  If you have labs (blood work) drawn today and your tests are completely normal, you will receive your results only by: [place] (if you have MyChart) OR A paper copy in the mail If you have any lab test that is abnormal or we need to change your treatment, we will call you to review the results.   Testing/Procedures:  Your physician has requested that you have an echocardiogram. Echocardiography is a painless test that uses sound waves to create images of your heart. It provides your doctor with information about the size and shape of your heart and how well your heart's chambers and valves are working. This procedure takes approximately one hour. There are no restrictions for this procedure. Please do NOT wear cologne, aftershave, or lotions (deodorant is allowed). Please arrive 15 minutes prior to your appointment time.    Follow-Up: At [facility], you and your health needs are our priority.  As part of our continuing mission to provide you with exceptional heart care, we have created designated Provider Care Teams.  These Care Teams include your primary Cardiologist (physician) and Advanced Practice Providers (APPs -  Physician Assistants and Nurse Practitioners) who all work together to provide you with the care you need, when you need it.  We recommend signing up for the patient portal called "MyChart".  Sign up information is provided on this After Visit Summary.  MyChart is used to connect with patients for Virtual Visits (Telemedicine).  Patients are able to view lab/test results, encounter notes, upcoming appointments, etc.  Non-urgent  messages can be sent to your provider as well.   To learn more about what you can do with MyChart, go to [email or web address].    Your next appointment:   4 month(s)  Provider:   Fransico Him, MD

## 2022-10-03 LAB — CBC
Hematocrit: 46.5 % (ref 37.5–51.0)
Hemoglobin: 15.9 g/dL (ref 13.0–17.7)
MCH: 29.6 pg (ref 26.6–33.0)
MCHC: 34.2 g/dL (ref 31.5–35.7)
MCV: 87 fL (ref 79–97)
Platelets: 204 10*3/uL (ref 150–450)
RBC: 5.37 x10E6/uL (ref 4.14–5.80)
RDW: 13.1 % (ref 11.6–15.4)
WBC: 6.7 10*3/uL (ref 3.4–10.8)

## 2022-10-03 LAB — LIPID PANEL
Chol/HDL Ratio: 2.8 ratio (ref 0.0–5.0)
Cholesterol, Total: 168 mg/dL (ref 100–199)
HDL: 60 mg/dL (ref >39)
LDL Chol Calc (NIH): 80 mg/dL (ref 0–99)
Triglycerides: 164 mg/dL — ABNORMAL HIGH (ref 0–149)
VLDL Cholesterol Cal: 28 mg/dL (ref 5–40)

## 2022-10-03 LAB — COMPREHENSIVE METABOLIC PANEL
ALT: 37 IU/L (ref 0–44)
AST: 37 IU/L (ref 0–40)
Albumin/Globulin Ratio: 2 (ref 1.2–2.2)
Albumin: 4.9 g/dL — ABNORMAL HIGH (ref 3.8–4.8)
Alkaline Phosphatase: 52 IU/L (ref 44–121)
BUN/Creatinine Ratio: 15 (ref 10–24)
BUN: 16 mg/dL (ref 8–27)
Bilirubin Total: 0.9 mg/dL (ref 0.0–1.2)
CO2: 27 mmol/L (ref 20–29)
Calcium: 9.5 mg/dL (ref 8.6–10.2)
Chloride: 100 mmol/L (ref 96–106)
Creatinine, Ser: 1.1 mg/dL (ref 0.76–1.27)
Globulin, Total: 2.4 g/dL (ref 1.5–4.5)
Glucose: 181 mg/dL — ABNORMAL HIGH (ref 70–99)
Potassium: 4.5 mmol/L (ref 3.5–5.2)
Sodium: 139 mmol/L (ref 134–144)
Total Protein: 7.3 g/dL (ref 6.0–8.5)
eGFR: 72 mL/min/{1.73_m2} (ref >59)

## 2022-10-07 DIAGNOSIS — H2511 Age-related nuclear cataract, right eye: Secondary | ICD-10-CM | POA: Diagnosis not present

## 2022-10-07 DIAGNOSIS — H401113 Primary open-angle glaucoma, right eye, severe stage: Secondary | ICD-10-CM | POA: Diagnosis not present

## 2022-10-30 ENCOUNTER — Other Ambulatory Visit (HOSPITAL_COMMUNITY): Payer: Medicare Other

## 2022-11-04 ENCOUNTER — Ambulatory Visit (HOSPITAL_COMMUNITY): Payer: Medicare Other | Attending: Cardiology

## 2022-11-04 DIAGNOSIS — E785 Hyperlipidemia, unspecified: Secondary | ICD-10-CM

## 2022-11-04 DIAGNOSIS — R0609 Other forms of dyspnea: Secondary | ICD-10-CM | POA: Diagnosis not present

## 2022-11-04 DIAGNOSIS — I10 Essential (primary) hypertension: Secondary | ICD-10-CM | POA: Diagnosis not present

## 2022-11-04 DIAGNOSIS — I2510 Atherosclerotic heart disease of native coronary artery without angina pectoris: Secondary | ICD-10-CM

## 2022-11-04 LAB — ECHOCARDIOGRAM COMPLETE
Area-P 1/2: 3.21 cm2
P 1/2 time: 572 msec
S' Lateral: 2.3 cm

## 2022-11-17 ENCOUNTER — Other Ambulatory Visit (HOSPITAL_COMMUNITY): Payer: Medicare Other

## 2022-12-22 ENCOUNTER — Encounter: Payer: Self-pay | Admitting: Cardiology

## 2022-12-22 ENCOUNTER — Ambulatory Visit: Payer: Medicare Other | Attending: Cardiology | Admitting: Cardiology

## 2022-12-22 VITALS — BP 120/60 | HR 70 | Ht 70.0 in | Wt 199.0 lb

## 2022-12-22 DIAGNOSIS — I2510 Atherosclerotic heart disease of native coronary artery without angina pectoris: Secondary | ICD-10-CM | POA: Diagnosis not present

## 2022-12-22 DIAGNOSIS — G4733 Obstructive sleep apnea (adult) (pediatric): Secondary | ICD-10-CM | POA: Diagnosis not present

## 2022-12-22 DIAGNOSIS — E785 Hyperlipidemia, unspecified: Secondary | ICD-10-CM

## 2022-12-22 DIAGNOSIS — I10 Essential (primary) hypertension: Secondary | ICD-10-CM | POA: Diagnosis not present

## 2022-12-22 DIAGNOSIS — R002 Palpitations: Secondary | ICD-10-CM
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
## 2022-12-22 NOTE — Addendum Note (Signed)
Addended by: Alvin Critchley A on: 12/22/2022 09:36 AM   Modules accepted: Orders

## 2022-12-22 NOTE — Patient Instructions (Addendum)
Medication Instructions:  .instme Your physician recommends that you continue on your current medications as directed. Please refer to the Current Medication list given to you today.  *If you need a refill on your cardiac medications before your next appointment, please call your pharmacy*  Lab Work: Lipids, ALT If you have labs (blood work) drawn today and your tests are completely normal, you will receive your results only by: MyChart Message (if you have MyChart) OR A paper copy in the mail If you have any lab test that is abnormal or we need to change your treatment, we will call you to review the results.  Follow-Up: At [facility], you and your health needs are our priority.  As part of our continuing mission to provide you with exceptional heart care, we have created designated Provider Care Teams.  These Care Teams include your primary Cardiologist (physician) and Advanced Practice Providers (APPs -  Physician Assistants and Nurse Practitioners) who all work together to provide you with the care you need, when you need it.  Your next appointment:   1 year(s)  Provider:   Armanda Magic, MD

## 2022-12-24 ENCOUNTER — Telehealth: Payer: Self-pay

## 2022-12-24 LAB — LIPID PANEL
Chol/HDL Ratio: 2.3 ratio (ref 0.0–5.0)
Cholesterol, Total: 110 mg/dL (ref 100–199)
HDL: 47 mg/dL (ref >39)
LDL Chol Calc (NIH): 43 mg/dL (ref 0–99)
Triglycerides: 107 mg/dL (ref 0–149)
VLDL Cholesterol Cal: 20 mg/dL (ref 5–40)

## 2022-12-24 LAB — ALT: ALT: 36 IU/L (ref 0–44)

## 2022-12-24 NOTE — Telephone Encounter (Signed)
-----   Message from Traci R Turner, MD sent at 12/24/2022 10:06 AM EDT ----- Lipids at goal continue current therapy and forward to PCP 

## 2022-12-24 NOTE — Telephone Encounter (Signed)
Discussed lipid results with patient who verbalized understanding that lipids are at goal and to continue current therapy. Forwarded results to PCP.

## 2022-12-29 DIAGNOSIS — D6869 Other thrombophilia: Secondary | ICD-10-CM | POA: Diagnosis not present

## 2022-12-29 DIAGNOSIS — I10 Essential (primary) hypertension: Secondary | ICD-10-CM | POA: Diagnosis not present

## 2022-12-29 DIAGNOSIS — E559 Vitamin D deficiency, unspecified: Secondary | ICD-10-CM | POA: Diagnosis not present

## 2022-12-29 DIAGNOSIS — E539 Vitamin B deficiency, unspecified: Secondary | ICD-10-CM | POA: Diagnosis not present

## 2022-12-29 DIAGNOSIS — E1165 Type 2 diabetes mellitus with hyperglycemia: Secondary | ICD-10-CM | POA: Diagnosis not present

## 2022-12-29 DIAGNOSIS — E1169 Type 2 diabetes mellitus with other specified complication: Secondary | ICD-10-CM | POA: Diagnosis not present

## 2022-12-29 DIAGNOSIS — E782 Mixed hyperlipidemia: Secondary | ICD-10-CM | POA: Diagnosis not present

## 2022-12-29 DIAGNOSIS — Z0000 Encounter for general adult medical examination without abnormal findings: Secondary | ICD-10-CM | POA: Diagnosis not present

## 2022-12-29 DIAGNOSIS — Z125 Encounter for screening for malignant neoplasm of prostate: Secondary | ICD-10-CM | POA: Diagnosis not present

## 2023-01-07 ENCOUNTER — Telehealth: Payer: Self-pay

## 2023-01-07 NOTE — Telephone Encounter (Signed)
-----   Message from Quintella Reichert, MD sent at 01/05/2023  4:22 PM EDT ----- LDL still not at goal - refer to lipid clinic ----- Message ----- From: Luellen Pucker, RN Sent: 01/05/2023   3:27 PM EDT To: Quintella Reichert, MD  FYI

## 2023-01-07 NOTE — Telephone Encounter (Signed)
Patient returning call about referral to lipid clinic. He states his PCP told him that his labs on 01/03/23 were very good and he does not need to change his cholesterol lowering medications. Patient states his LDL on 01/03/23 was 54, and he feels he does not need to change anything about his regimen. I reviewed with patient that his non-HDL cholesterol was a little higher than 70 at 78, patient states he really does not want to change his regimen unless Dr. Mayford Knife feels he has elevated cardiac risks if he doesn't. Patient further questioning how his LDL could be bad if his LDL was 43 with our office on 12/22/22. Forwarded to Dr. Mayford Knife for advice.

## 2023-01-07 NOTE — Telephone Encounter (Signed)
Per Dr. Mayford Knife, patient needs referral to lipid clinic as LDL is still not at goal. Called to discuss w/ patient, no answer. Left message with no recipient identifiers asking recipient to call office.

## 2023-01-07 NOTE — Telephone Encounter (Signed)
-----   Message from Traci R Turner, MD sent at 01/05/2023  4:22 PM EDT ----- LDL still not at goal - refer to lipid clinic ----- Message ----- From: Rozanne Heumann L, RN Sent: 01/05/2023   3:27 PM EDT To: Traci R Turner, MD  FYI  

## 2023-01-13 DIAGNOSIS — H2511 Age-related nuclear cataract, right eye: Secondary | ICD-10-CM | POA: Diagnosis not present

## 2023-01-13 DIAGNOSIS — H401123 Primary open-angle glaucoma, left eye, severe stage: Secondary | ICD-10-CM | POA: Diagnosis not present

## 2023-01-21 NOTE — Telephone Encounter (Signed)
Reviewed labs from PCP which show LDL <70. Labs resulted around 12/29/22 and were reviewed by PCP around 01/03/19. Called patient and let him know that his LDL is at goal and per Dr. Mayford Knife no referral to lipid clinic is needed. Patient verbalizes understanding.

## 2023-02-23 ENCOUNTER — Other Ambulatory Visit: Payer: Self-pay | Admitting: Cardiology

## 2023-02-24 ENCOUNTER — Other Ambulatory Visit: Payer: Self-pay | Admitting: Cardiology

## 2023-03-21 ENCOUNTER — Other Ambulatory Visit: Payer: Self-pay | Admitting: Nurse Practitioner

## 2023-03-23 ENCOUNTER — Other Ambulatory Visit: Payer: Self-pay | Admitting: Nurse Practitioner

## 2023-04-13 DIAGNOSIS — H401123 Primary open-angle glaucoma, left eye, severe stage: Secondary | ICD-10-CM | POA: Diagnosis not present

## 2023-06-29 DIAGNOSIS — I10 Essential (primary) hypertension: Secondary | ICD-10-CM | POA: Diagnosis not present

## 2023-06-29 DIAGNOSIS — E1169 Type 2 diabetes mellitus with other specified complication: Secondary | ICD-10-CM | POA: Diagnosis not present

## 2023-06-29 DIAGNOSIS — I700 Atherosclerosis of aorta: Secondary | ICD-10-CM | POA: Diagnosis not present

## 2023-06-29 DIAGNOSIS — E782 Mixed hyperlipidemia: Secondary | ICD-10-CM | POA: Diagnosis not present

## 2023-07-02 ENCOUNTER — Telehealth: Payer: Self-pay | Admitting: Cardiology

## 2023-07-02 MED ORDER — ISOSORBIDE MONONITRATE ER 30 MG PO TB24: 30.0000 mg | ORAL_TABLET | Freq: Every day | ORAL | 1 refills | Status: DC

## 2023-07-02 NOTE — Telephone Encounter (Signed)
Pt's medication was sent to pt's pharmacy as requested. Confirmation received.  °

## 2023-07-02 NOTE — Telephone Encounter (Signed)
*  STAT* If patient is at the pharmacy, call can be transferred to refill team.   1. Which medications need to be refilled? (please list name of each medication and dose if known) isosorbide mononitrate (IMDUR) 30 MG 24 hr tablet    2. Would you like to learn more about the convenience, safety, & potential cost savings by using the [facility]?    3. Are you open to using the [facility] (Type [facility].  ).   4. Which pharmacy/location (including street and city if local pharmacy) is medication to be sent to? [id number] - [place], [place] - [address]    5. Do they need a 30 day or 90 day supply? 90 day

## 2023-07-07 DIAGNOSIS — H401111 Primary open-angle glaucoma, right eye, mild stage: Secondary | ICD-10-CM | POA: Diagnosis not present

## 2023-07-07 DIAGNOSIS — H2513 Age-related nuclear cataract, bilateral: Secondary | ICD-10-CM | POA: Diagnosis not present

## 2023-09-04 DIAGNOSIS — H2511 Age-related nuclear cataract, right eye: Secondary | ICD-10-CM | POA: Diagnosis not present

## 2023-09-04 DIAGNOSIS — H401111 Primary open-angle glaucoma, right eye, mild stage: Secondary | ICD-10-CM | POA: Diagnosis not present

## 2023-09-28 ENCOUNTER — Other Ambulatory Visit: Payer: Self-pay | Admitting: Nurse Practitioner

## 2023-10-12 DIAGNOSIS — D225 Melanocytic nevi of trunk: Secondary | ICD-10-CM | POA: Diagnosis not present

## 2023-10-12 DIAGNOSIS — L578 Other skin changes due to chronic exposure to nonionizing radiation: Secondary | ICD-10-CM | POA: Diagnosis not present

## 2023-10-12 DIAGNOSIS — L821 Other seborrheic keratosis: Secondary | ICD-10-CM | POA: Diagnosis not present

## 2023-10-12 DIAGNOSIS — L814 Other melanin hyperpigmentation: Secondary | ICD-10-CM | POA: Diagnosis not present

## 2023-10-19 ENCOUNTER — Telehealth: Payer: Self-pay | Admitting: Cardiology

## 2023-10-19 NOTE — Telephone Encounter (Signed)
Patient with diagnosis of PE on Xarelto for anticoagulation.    Procedure: Cataract surgery Date of procedure: 10/21/23   CrCl Do not see current labs Platelet count Do not see current labs on file   Per office protocol, patient can hold Xarelto for 2 days prior to procedure. Please let MD know there are not current labs on file.   **This guidance is not considered finalized until pre-operative APP has relayed final recommendations.**

## 2023-10-19 NOTE — Telephone Encounter (Signed)
   Pre-operative Risk Assessment    Patient Name: Corey Gill  DOB: 07-09-1951 MRN: [id number]      Request for Surgical Clearance    Procedure:   cataract surg  Date of Surgery:  Clearance 10/21/23                                 Surgeon:  Sinda Du Surgeon's Group or Practice Name:  [facility] Phone number:  [phone] Fax number:  [phone]   Type of Clearance Requested:   - Pharmacy:  Hold Rivaroxaban (Xarelto)     Type of Anesthesia:  General    Additional requests/questions:   None  Signed, April L Harrington   10/19/2023, 3:29 PM

## 2023-10-19 NOTE — Telephone Encounter (Signed)
Received call from [place] at [facility] that patient is getting cataract surgery with retrobulbar block with eye stent on right eye with surgeon Sinda Du on 10/21/23 need to hold xarelto 48 hours prior, general anesthesia

## 2023-10-19 NOTE — Telephone Encounter (Signed)
   Patient Name: Corey Gill  DOB: 1951/06/22 MRN: [id number]  Primary Cardiologist: Armanda Magic, MD  Chart reviewed as part of pre-operative protocol coverage. Cataract extractions are recognized in guidelines as low risk surgeries that do not typically require specific preoperative testing or holding of blood thinner therapy. Therefore, given past medical history and time since last visit, based on ACC/AHA guidelines, Corey Gill would be at acceptable risk for the planned procedure without further cardiovascular testing.   I will route this recommendation to the requesting party via Epic fax function and remove from pre-op pool.  Please call with questions.  Ronney Asters, NP 10/19/2023, 3:36 PM

## 2023-10-20 ENCOUNTER — Telehealth: Payer: Self-pay | Admitting: Cardiology

## 2023-10-20 NOTE — Telephone Encounter (Signed)
Follow Up:       Patient says he  needs someone to call him asap. He says he  is scheduled for surgery tomorrow(10-21-23) and he says his surgeon office said they had not heard from this office.

## 2023-10-20 NOTE — Telephone Encounter (Signed)
I left a message for the requesting office surgery scheduler pt has been cleared and notes have been faxed this morning by Edd Fabian, FNP as well as I have faxed notes this morning.

## 2023-10-20 NOTE — Telephone Encounter (Signed)
I s.w the pt and assured him that he has been cleared and ok to hold Xarleto x 2 days. I assured the pt that the clearance was sent this morning by Edd Fabian, FNP @ 7:08 am. I did tell the pt that the office only sent [place] the request yesterday.

## 2023-10-20 NOTE — Telephone Encounter (Signed)
     Primary Cardiologist: Wilbert Bihari, MD  Clinical pharmacist have reviewed patient's surgical request and provided the following recommendations for , Corey Gill   Patient with diagnosis of PE on Xarelto  for anticoagulation.     Procedure: Cataract surgery Date of procedure: 10/21/23     CrCl Do not see current labs Platelet count Do not see current labs on file     Per office protocol, patient can hold Xarelto  for 2 days prior to procedure.  Patient does not have any current labs on file. I will route this recommendation to the requesting party via Epic fax function and remove from pre-op pool.  Please call with questions.  [name] Iren Whipp NP-C     10/20/2023, 7:08 AM [facility] [address] Office [phone] Fax [phone]

## 2023-10-21 DIAGNOSIS — H2511 Age-related nuclear cataract, right eye: Secondary | ICD-10-CM | POA: Diagnosis not present

## 2023-10-21 DIAGNOSIS — H269 Unspecified cataract: Secondary | ICD-10-CM | POA: Diagnosis not present

## 2023-10-21 DIAGNOSIS — H401110 Primary open-angle glaucoma, right eye, stage unspecified: Secondary | ICD-10-CM | POA: Diagnosis not present

## 2023-10-21 DIAGNOSIS — H401111 Primary open-angle glaucoma, right eye, mild stage: Secondary | ICD-10-CM | POA: Diagnosis not present

## 2023-10-21 DIAGNOSIS — H409 Unspecified glaucoma: Secondary | ICD-10-CM | POA: Diagnosis not present

## 2023-11-06 DIAGNOSIS — H1131 Conjunctival hemorrhage, right eye: Secondary | ICD-10-CM | POA: Diagnosis not present

## 2023-12-08 DIAGNOSIS — Z961 Presence of intraocular lens: Secondary | ICD-10-CM | POA: Diagnosis not present

## 2023-12-24 ENCOUNTER — Ambulatory Visit: Payer: Medicare Other | Attending: Cardiology | Admitting: Cardiology

## 2023-12-24 ENCOUNTER — Other Ambulatory Visit: Payer: Self-pay

## 2023-12-24 ENCOUNTER — Encounter: Payer: Self-pay | Admitting: Cardiology

## 2023-12-24 VITALS — BP 124/62 | HR 65 | Ht 70.0 in | Wt 204.6 lb

## 2023-12-24 DIAGNOSIS — G4733 Obstructive sleep apnea (adult) (pediatric): Secondary | ICD-10-CM | POA: Diagnosis not present

## 2023-12-24 DIAGNOSIS — I2510 Atherosclerotic heart disease of native coronary artery without angina pectoris: Secondary | ICD-10-CM | POA: Diagnosis not present

## 2023-12-24 DIAGNOSIS — I10 Essential (primary) hypertension: Secondary | ICD-10-CM

## 2023-12-24 DIAGNOSIS — E785 Hyperlipidemia, unspecified: Secondary | ICD-10-CM | POA: Diagnosis not present

## 2023-12-24 LAB — LIPID PANEL
Chol/HDL Ratio: 2.3 ratio (ref 0.0–5.0)
Cholesterol, Total: 119 mg/dL (ref 100–199)
HDL: 51 mg/dL (ref >39)
LDL Chol Calc (NIH): 49 mg/dL (ref 0–99)
Triglycerides: 102 mg/dL (ref 0–149)
VLDL Cholesterol Cal: 19 mg/dL (ref 5–40)

## 2023-12-24 LAB — COMPREHENSIVE METABOLIC PANEL WITH GFR
ALT: 25 IU/L (ref 0–44)
AST: 31 IU/L (ref 0–40)
Albumin: 4.7 g/dL (ref 3.8–4.8)
Alkaline Phosphatase: 58 IU/L (ref 44–121)
BUN/Creatinine Ratio: 14 (ref 10–24)
BUN: 13 mg/dL (ref 8–27)
Bilirubin Total: 0.8 mg/dL (ref 0.0–1.2)
CO2: 25 mmol/L (ref 20–29)
Calcium: 9.2 mg/dL (ref 8.6–10.2)
Chloride: 101 mmol/L (ref 96–106)
Creatinine, Ser: 0.96 mg/dL (ref 0.76–1.27)
Globulin, Total: 2.1 g/dL (ref 1.5–4.5)
Glucose: 149 mg/dL — ABNORMAL HIGH (ref 70–99)
Potassium: 4.2 mmol/L (ref 3.5–5.2)
Sodium: 140 mmol/L (ref 134–144)
Total Protein: 6.8 g/dL (ref 6.0–8.5)
eGFR: 84 mL/min/{1.73_m2} (ref >59)

## 2023-12-24 MED ORDER — POTASSIUM CHLORIDE CRYS ER 10 MEQ PO TBCR: 10.0000 meq | EXTENDED_RELEASE_TABLET | Freq: Every day | ORAL | 3 refills | Status: AC

## 2023-12-24 MED ORDER — EZETIMIBE 10 MG PO TABS: 10.0000 mg | ORAL_TABLET | Freq: Every day | ORAL | 3 refills | Status: AC

## 2023-12-24 MED ORDER — PRAVASTATIN SODIUM 80 MG PO TABS: 80.0000 mg | ORAL_TABLET | Freq: Every day | ORAL | 3 refills | Status: AC

## 2023-12-24 MED ORDER — AMLODIPINE BESYLATE 5 MG PO TABS: 5.0000 mg | ORAL_TABLET | Freq: Every day | ORAL | 3 refills | Status: AC

## 2023-12-24 NOTE — Addendum Note (Signed)
 Addended by: Bertram Millard on: 12/24/2023 08:39 AM   Modules accepted: Orders

## 2023-12-24 NOTE — Patient Instructions (Signed)
 Medication Instructions:   *If you need a refill on your cardiac medications before your next appointment, please call your pharmacy*  Lab Work: Cmet and lipid   If you have labs (blood work) drawn today and your tests are completely normal, you will receive your results only by: MyChart Message (if you have MyChart) OR A paper copy in the mail If you have any lab test that is abnormal or we need to change your treatment, we will call you to review the results.  Testing/Procedures:    Please report to Radiology at the [facility] Main Entrance 30 minutes early for your test.  [address] [place], [place] [address]                         OR   Please report to Radiology at [facility] Main Entrance, medical mall, 30 mins prior to your test.  [address]  [place], [place]  How to Prepare for Your Cardiac PET/CT Stress Test:  Nothing to eat or drink, except water, 3 hours prior to arrival time.  NO caffeine/decaffeinated products, or chocolate 12 hours prior to arrival. (Please note decaffeinated beverages (teas/coffees) still contain caffeine).  If you have caffeine within 12 hours prior, the test will need to be rescheduled.  Medication instructions: Do not take erectile dysfunction medications for 72 hours prior to test (sildenafil, tadalafil)  Diabetic Preparation: If able to eat breakfast prior to 3 hour fasting, you may take all medications If you do not eat prior to 3 hour fast-Hold all diabetes (oral and insulin) medications.   You may take your remaining medications with water.  NO perfume, cologne or lotion on chest or abdomen area. FEMALES - Please avoid wearing dresses to this appointment.  Total time is 1 to 2 hours; you may want to bring reading material for the waiting time.  In preparation for your appointment, medication and supplies will be purchased.  Appointment availability is limited, so if you need to cancel  or reschedule, please call the Radiology Department Scheduler at [phone] 24 hours in advance to avoid a cancellation fee of $100.00  What to Expect When you Arrive:  Once you arrive and check in for your appointment, you will be taken to a preparation room within the Radiology Department.  A technologist or Nurse will obtain your medical history, verify that you are correctly prepped for the exam, and explain the procedure.  Afterwards, an IV will be started in your arm and electrodes will be placed on your skin for EKG monitoring during the stress portion of the exam. Then you will be escorted to the PET/CT scanner.  There, staff will get you positioned on the scanner and obtain a blood pressure and EKG.  During the exam, you will continue to be connected to the EKG and blood pressure machines.  A small, safe amount of a radioactive tracer will be injected in your IV to obtain a series of pictures of your heart along with an injection of a stress agent.    After your Exam:  It is recommended that you eat a meal and drink a caffeinated beverage to counter act any effects of the stress agent.  Drink plenty of fluids for the remainder of the day and urinate frequently for the first couple of hours after the exam.  Your doctor will inform you of your test results within 7-10 business days.  For more  information and frequently asked questions, please visit our website: [email or web address]  For questions about your test or how to prepare for your test, please call: Cardiac Imaging Nurse Navigators Office: [phone]   Follow-Up: At [facility] you and your health needs are our priority.  As part of our continuing mission to provide you with exceptional heart care, our providers are all part of one team.  This team includes your primary Cardiologist (physician) and Advanced Practice Providers or APPs (Physician Assistants and Nurse Practitioners) who all work together to  provide you with the care you need, when you need it.  Your next appointment:  one year    We recommend signing up for the patient portal called "MyChart".  Sign up information is provided on this After Visit Summary.  MyChart is used to connect with patients for Virtual Visits (Telemedicine).  Patients are able to view lab/test results, encounter notes, upcoming appointments, etc.  Non-urgent messages can be sent to your provider as well.   To learn more about what you can do with MyChart, go to [email or web address].   Other Instructions       1st Floor: - Lobby - Registration  - Pharmacy  - Lab - Cafe  2nd Floor: - PV Lab - Diagnostic Testing (echo, CT, nuclear med)  3rd Floor: - Vacant  4th Floor: - TCTS (cardiothoracic surgery) - AFib Clinic - Structural Heart Clinic - Vascular Surgery  - Vascular Ultrasound  5th Floor: - HeartCare Cardiology (general and EP) - Clinical Pharmacy for coumadin, hypertension, lipid, weight-loss medications, and med management appointments    Valet parking services will be available as well.

## 2023-12-24 NOTE — Addendum Note (Signed)
 Addended by: Bertram Millard on: 12/24/2023 08:34 AM   Modules accepted: Orders

## 2023-12-24 NOTE — Progress Notes (Addendum)
 Date:  12/24/2023   ID:  Corey Gill, DOB 10-17-1950, MRN [id number]  PCP:  Soundra Pilon, FNP  Cardiologist:  Armanda Magic, MD  Electrophysiologist:  None   Chief Complaint: CAD, HLD, HTN  History of Present Illness:    Corey Gill is a 73 y.o. male with a hx of OSA intolerant to CPAP, HTN, non-obstructive CAD (minimal CAD in RCA 0-24%, 25-49% distal LM and 25-49% in the ostial LAD by coronary CTA 2020 with normal FFR), HLD and obesity. He has been followed by Dr. Delton Coombes for multiple subsegmental PE's and was referred to Hematology, Dr. Candise Che, and was found to be positive for Lupus anticoagulant..  Nuclear stress test for shortness of breath done 11/11/2021 showing mild reduction in uptake in the apical inferior location that was reversible consistent with ischemia with EF 70%.  This was low risk scan.  He is here today for followup and is doing well.  He tells me that yesterday he was out cutting wood and started to notice that his heart was racing.  He stopped cutting wood and rested and it resolve.  He got back up to start picking up the wood and starting having chest pain that he described as heaviness with no radiation.  There was no associated sx of nausea or diaphoresis.  He does at times have DOE as well.  This is the first time he has had CP recently. He denies any  PND, orthopnea, LE edema, dizziness,  or syncope. He is compliant with his meds and is tolerating meds with no SE.    Prior CV studies:   The following studies were reviewed today:  EKG  Past Medical History:  Diagnosis Date   Benign fasciculation-cramp syndrome 03/28/2013   CAD (coronary artery disease), native coronary artery    minimal CAD in RCA 0-24%, 25-49% distal LM and 25-49% in the ostial LAD by coronary CTA with normal FFR   Diabetes mellitus without complication (HCC)    Diverticulosis    Essential hypertension    GERD (gastroesophageal reflux disease)    Glaucoma    Bilateral   Headache     History of colonoscopy    HLD (hyperlipidemia)    OSA on CPAP    Psoriasis    Pulmonary emboli (HCC)    positive lupus anticoagulant now on Xarelto   Past Surgical History:  Procedure Laterality Date   APPENDECTOMY     COLON SURGERY  2007   Due to tumor, benign   HERNIA REPAIR  2006   Right inguinal   SPINE SURGERY  1982   Due to whiplash injury, cervical     Current Meds  Medication Sig   amLODipine (NORVASC) 5 MG tablet TAKE 1 TABLET(5 MG) BY MOUTH DAILY   augmented betamethasone dipropionate (DIPROLENE-AF) 0.05 % cream Apply 1 drop topically 2 (two) times daily.   betamethasone dipropionate 0.05 % cream 1 application Externally twice a day for 14 days   dorzolamide (TRUSOPT) 2 % ophthalmic solution Place 1 drop into both eyes 2 (two) times daily.    ezetimibe (ZETIA) 10 MG tablet Take 1 tablet (10 mg total) by mouth daily.   fexofenadine (ALLEGRA ALLERGY) 180 MG tablet Take 1 tablet (180 mg total) by mouth daily.   glimepiride (AMARYL) 2 MG tablet Take 2 mg by mouth daily with breakfast.   halobetasol (ULTRAVATE) 0.05 % ointment 1 application   hydrochlorothiazide (HYDRODIURIL) 25 MG tablet TAKE 1 TABLET(25 MG) BY MOUTH DAILY   isosorbide  mononitrate (IMDUR) 30 MG 24 hr tablet Take 1 tablet (30 mg total) by mouth daily.   mometasone (ELOCON) 0.1 % lotion 2-3 drop to affected area   mometasone (ELOCON) 0.1 % ointment Apply 1 application topically daily.   omeprazole (PRILOSEC) 20 MG capsule Take 20 mg by mouth daily.   potassium chloride (KLOR-CON M) 10 MEQ tablet TAKE 1 TABLET(10 MEQ) BY MOUTH DAILY   pravastatin (PRAVACHOL) 80 MG tablet TAKE 1 TABLET(80 MG) BY MOUTH EVERY EVENING   rivaroxaban (XARELTO) 20 MG TABS tablet TAKE 1 TABLET(20 MG) BY MOUTH DAILY WITH SUPPER   timolol (BETIMOL) 0.5 % ophthalmic solution Place 2 drops into both eyes daily.   tobramycin-dexamethasone (TOBRADEX) ophthalmic ointment 1 application   triamcinolone (KENALOG) 0.025 % cream Apply topically  2 (two) times daily.     Allergies:   Rosuvastatin, Rosuvastatin calcium, Aspirin, Avapro [irbesartan], Latanoprost, Alphagan  [brimonidine], Bimatoprost, Brimonidine tartrate, Metformin hcl, and Mushroom extract complex (obsolete)   Social History   Tobacco Use   Smoking status: Former    Current packs/day: 0.00    Average packs/day: 0.5 packs/day for 17.0 years (8.5 ttl pk-yrs)    Types: Cigarettes    Start date: 10/13/1976    Quit date: 09/15/1993    Years since quitting: 30.2   Smokeless tobacco: Never  Vaping Use   Vaping status: Never Used  Substance Use Topics   Alcohol use: No   Drug use: No     Family Hx: The patient's family history includes Angina in his sister; Colon polyps in his mother; GER disease in his mother; Parkinson's disease in his father; Prostate cancer in his brother and father; Skin cancer in his mother.  ROS:   Please see the history of present illness.     All other systems reviewed and are negative.   Labs/Other Tests and Data Reviewed:    EKG Interpretation Date/Time:  Thursday December 24 2023 08:11:20 EDT Ventricular Rate:  65 PR Interval:  192 QRS Duration:  90 QT Interval:  416 QTC Calculation: 432 R Axis:   -30  Text Interpretation: Normal sinus rhythm Left axis deviation When compared with ECG of 25-Aug-2019 14:58, QRS axis Shifted left Confirmed by Armanda Magic ([id number]) on 12/24/2023 8:29:46 AM    Recent Labs: No results found for requested labs within last 365 days.   Recent Lipid Panel Lab Results  Component Value Date/Time   CHOL 110 12/22/2022 09:26 AM   TRIG 107 12/22/2022 09:26 AM   HDL 47 12/22/2022 09:26 AM   CHOLHDL 2.3 12/22/2022 09:26 AM   CHOLHDL 3.1 08/25/2019 05:00 PM   LDLCALC 43 12/22/2022 09:26 AM    Wt Readings from Last 3 Encounters:  12/24/23 204 lb 9.6 oz (92.8 kg)  12/22/22 199 lb (90.3 kg)  10/02/22 208 lb (94.3 kg)    Objective:    Vital Signs:  BP 124/62   Pulse 65   Ht 5\' 10"  (1.778 m)   Wt  204 lb 9.6 oz (92.8 kg)   SpO2 98%   BMI 29.36 kg/m   GEN: Well nourished, well developed in no acute distress HEENT: Normal NECK: No JVD; No carotid bruits LYMPHATICS: No lymphadenopathy CARDIAC:RRR, no murmurs, rubs, gallops RESPIRATORY:  Clear to auscultation without rales, wheezing or rhonchi  ABDOMEN: Soft, non-tender, non-distended MUSCULOSKELETAL:  No edema; No deformity  SKIN: Warm and dry NEUROLOGIC:  Alert and oriented x 3 PSYCHIATRIC:  Normal affect  EKG was performed in the office today and showed NSR  with no ST changes ASSESSMENT & PLAN:    OSA - intolerant to CPAP  HTN -BP is adequately controlled on exam today -Continue drug management with amlodipine 5 mg daily, HCTZ 25 mg daily, Imdur 30 mg daily with as needed refills -I have personally reviewed and interpreted outside labs performed by patient's PCP which showed serum creatinine 1.1 on 10/02/2022 -Repeat bmet  Non obstructive ASCAD -minimal CAD in RCA 0-24%, 25-49% distal LM and 25-49% in the ostial LAD  -Nuclear stress test 11/04/2021 showed a small defect with mild reduction in uptake in the apical inferior location that was reversible consistent with ischemia with EF 70%.  This was low risk scan. -he had an episode of CP yesterday while cutting wood that is concerning for angina -I think we should get a Stress PET CT to rule out ischemia. -Informed Consent   Shared Decision Making/Informed Consent The risks [chest pain, shortness of breath, cardiac arrhythmias, dizziness, blood pressure fluctuations, myocardial infarction, stroke/transient ischemic attack, nausea, vomiting, allergic reaction, radiation exposure, metallic taste sensation and life-threatening complications (estimated to be 1 in 10,000)], benefits (risk stratification, diagnosing coronary artery disease, treatment guidance) and alternatives of a cardiac PET stress test were discussed in detail with Mr. Niu and he agrees to proceed.    -Continue  prescription drug management with statin, Imdur 30 mg daily with as needed refills -No aspirin due to DOAC  HLD -LDL goal < 70 -I have personally reviewed and interpreted outside labs performed by patient's PCP which showed 54, HDL 49, on 12/29/2022 -Repeat FLP and ALT -Continue prescription drug management with pravastatin 80 mg daily and Zetia 10 mg daily with as needed refills  Medication Adjustments/Labs and Tests Ordered: Current medicines are reviewed at length with the patient today.  Concerns regarding medicines are outlined above.  Tests Ordered: Orders Placed This Encounter  Procedures   EKG 12-Lead   Medication Changes: No orders of the defined types were placed in this encounter.   Disposition:  Follow up in 1 year(s)  Signed, Armanda Magic, MD  12/24/2023 8:20 AM    [facility]

## 2023-12-31 NOTE — Addendum Note (Signed)
 Addended by: Jacqueline Matsu on: 12/31/2023 09:16 AM   Modules accepted: Orders

## 2024-01-05 ENCOUNTER — Other Ambulatory Visit: Payer: Self-pay | Admitting: Cardiology

## 2024-01-05 DIAGNOSIS — I700 Atherosclerosis of aorta: Secondary | ICD-10-CM | POA: Diagnosis not present

## 2024-01-05 DIAGNOSIS — L409 Psoriasis, unspecified: Secondary | ICD-10-CM | POA: Diagnosis not present

## 2024-01-05 DIAGNOSIS — Z0000 Encounter for general adult medical examination without abnormal findings: Secondary | ICD-10-CM | POA: Diagnosis not present

## 2024-01-05 DIAGNOSIS — E539 Vitamin B deficiency, unspecified: Secondary | ICD-10-CM | POA: Diagnosis not present

## 2024-01-05 DIAGNOSIS — I10 Essential (primary) hypertension: Secondary | ICD-10-CM | POA: Diagnosis not present

## 2024-01-05 DIAGNOSIS — Z125 Encounter for screening for malignant neoplasm of prostate: Secondary | ICD-10-CM | POA: Diagnosis not present

## 2024-01-05 DIAGNOSIS — K219 Gastro-esophageal reflux disease without esophagitis: Secondary | ICD-10-CM | POA: Diagnosis not present

## 2024-01-05 DIAGNOSIS — E559 Vitamin D deficiency, unspecified: Secondary | ICD-10-CM | POA: Diagnosis not present

## 2024-01-05 DIAGNOSIS — E538 Deficiency of other specified B group vitamins: Secondary | ICD-10-CM | POA: Diagnosis not present

## 2024-01-05 DIAGNOSIS — E1169 Type 2 diabetes mellitus with other specified complication: Secondary | ICD-10-CM | POA: Diagnosis not present

## 2024-01-05 DIAGNOSIS — E782 Mixed hyperlipidemia: Secondary | ICD-10-CM | POA: Diagnosis not present

## 2024-01-20 DIAGNOSIS — R29707 NIHSS score 7: Secondary | ICD-10-CM | POA: Diagnosis not present

## 2024-01-20 DIAGNOSIS — Z87891 Personal history of nicotine dependence: Secondary | ICD-10-CM | POA: Diagnosis not present

## 2024-01-20 DIAGNOSIS — I4891 Unspecified atrial fibrillation: Secondary | ICD-10-CM | POA: Diagnosis not present

## 2024-01-20 DIAGNOSIS — I6782 Cerebral ischemia: Secondary | ICD-10-CM | POA: Diagnosis not present

## 2024-01-20 DIAGNOSIS — R4701 Aphasia: Secondary | ICD-10-CM | POA: Diagnosis not present

## 2024-01-20 DIAGNOSIS — I10 Essential (primary) hypertension: Secondary | ICD-10-CM | POA: Diagnosis not present

## 2024-01-20 DIAGNOSIS — Z743 Need for continuous supervision: Secondary | ICD-10-CM | POA: Diagnosis not present

## 2024-01-20 DIAGNOSIS — R55 Syncope and collapse: Secondary | ICD-10-CM | POA: Diagnosis not present

## 2024-01-20 DIAGNOSIS — I639 Cerebral infarction, unspecified: Secondary | ICD-10-CM | POA: Diagnosis not present

## 2024-01-20 DIAGNOSIS — I77819 Aortic ectasia, unspecified site: Secondary | ICD-10-CM | POA: Diagnosis not present

## 2024-01-20 DIAGNOSIS — L409 Psoriasis, unspecified: Secondary | ICD-10-CM | POA: Diagnosis not present

## 2024-01-20 DIAGNOSIS — I69320 Aphasia following cerebral infarction: Secondary | ICD-10-CM | POA: Diagnosis not present

## 2024-01-20 DIAGNOSIS — I2510 Atherosclerotic heart disease of native coronary artery without angina pectoris: Secondary | ICD-10-CM | POA: Diagnosis not present

## 2024-01-20 DIAGNOSIS — I69354 Hemiplegia and hemiparesis following cerebral infarction affecting left non-dominant side: Secondary | ICD-10-CM | POA: Diagnosis not present

## 2024-01-20 DIAGNOSIS — R41841 Cognitive communication deficit: Secondary | ICD-10-CM | POA: Diagnosis not present

## 2024-01-20 DIAGNOSIS — T1490XA Injury, unspecified, initial encounter: Secondary | ICD-10-CM | POA: Diagnosis not present

## 2024-01-20 DIAGNOSIS — G4733 Obstructive sleep apnea (adult) (pediatric): Secondary | ICD-10-CM | POA: Diagnosis not present

## 2024-01-20 DIAGNOSIS — G459 Transient cerebral ischemic attack, unspecified: Secondary | ICD-10-CM | POA: Diagnosis not present

## 2024-01-20 DIAGNOSIS — I083 Combined rheumatic disorders of mitral, aortic and tricuspid valves: Secondary | ICD-10-CM | POA: Diagnosis not present

## 2024-01-20 DIAGNOSIS — Z86711 Personal history of pulmonary embolism: Secondary | ICD-10-CM | POA: Diagnosis not present

## 2024-01-20 DIAGNOSIS — H5462 Unqualified visual loss, left eye, normal vision right eye: Secondary | ICD-10-CM | POA: Diagnosis not present

## 2024-01-20 DIAGNOSIS — R269 Unspecified abnormalities of gait and mobility: Secondary | ICD-10-CM | POA: Diagnosis not present

## 2024-01-20 DIAGNOSIS — R079 Chest pain, unspecified: Secondary | ICD-10-CM | POA: Diagnosis not present

## 2024-01-20 DIAGNOSIS — Z7984 Long term (current) use of oral hypoglycemic drugs: Secondary | ICD-10-CM | POA: Diagnosis not present

## 2024-01-20 DIAGNOSIS — Z7901 Long term (current) use of anticoagulants: Secondary | ICD-10-CM | POA: Diagnosis not present

## 2024-01-20 DIAGNOSIS — Z79899 Other long term (current) drug therapy: Secondary | ICD-10-CM | POA: Diagnosis not present

## 2024-01-20 DIAGNOSIS — Y92838 Other recreation area as the place of occurrence of the external cause: Secondary | ICD-10-CM | POA: Diagnosis not present

## 2024-01-20 DIAGNOSIS — R531 Weakness: Secondary | ICD-10-CM | POA: Diagnosis not present

## 2024-01-20 DIAGNOSIS — W19XXXA Unspecified fall, initial encounter: Secondary | ICD-10-CM | POA: Diagnosis not present

## 2024-01-20 DIAGNOSIS — E119 Type 2 diabetes mellitus without complications: Secondary | ICD-10-CM | POA: Diagnosis not present

## 2024-01-29 DIAGNOSIS — H10413 Chronic giant papillary conjunctivitis, bilateral: Secondary | ICD-10-CM | POA: Diagnosis not present

## 2024-02-05 DIAGNOSIS — H10413 Chronic giant papillary conjunctivitis, bilateral: Secondary | ICD-10-CM | POA: Diagnosis not present

## 2024-02-09 DIAGNOSIS — E782 Mixed hyperlipidemia: Secondary | ICD-10-CM | POA: Diagnosis not present

## 2024-02-09 DIAGNOSIS — I700 Atherosclerosis of aorta: Secondary | ICD-10-CM | POA: Diagnosis not present

## 2024-02-09 DIAGNOSIS — D6869 Other thrombophilia: Secondary | ICD-10-CM | POA: Diagnosis not present

## 2024-02-23 DIAGNOSIS — Z961 Presence of intraocular lens: Secondary | ICD-10-CM | POA: Diagnosis not present

## 2024-02-23 DIAGNOSIS — H401132 Primary open-angle glaucoma, bilateral, moderate stage: Secondary | ICD-10-CM | POA: Diagnosis not present

## 2024-03-07 ENCOUNTER — Encounter (HOSPITAL_COMMUNITY): Payer: Self-pay

## 2024-03-09 ENCOUNTER — Ambulatory Visit: Payer: Self-pay | Admitting: Cardiology

## 2024-03-09 ENCOUNTER — Encounter (HOSPITAL_COMMUNITY)
Admission: RE | Admit: 2024-03-09 | Discharge: 2024-03-09 | Disposition: A | Discharge status: Home / Self Care | Source: Ambulatory Visit | Attending: Cardiology | Admitting: Cardiology

## 2024-03-09 DIAGNOSIS — G4733 Obstructive sleep apnea (adult) (pediatric): Secondary | ICD-10-CM | POA: Diagnosis not present

## 2024-03-09 DIAGNOSIS — I2510 Atherosclerotic heart disease of native coronary artery without angina pectoris: Secondary | ICD-10-CM | POA: Diagnosis not present

## 2024-03-09 DIAGNOSIS — E785 Hyperlipidemia, unspecified: Secondary | ICD-10-CM | POA: Insufficient documentation

## 2024-03-09 DIAGNOSIS — I10 Essential (primary) hypertension: Secondary | ICD-10-CM | POA: Diagnosis not present

## 2024-03-09 LAB — NM PET CT CARDIAC PERFUSION MULTI W/ABSOLUTE BLOODFLOW
MBFR: 2.8
Nuc Rest EF: 56 %
Nuc Stress EF: 63 %
Rest MBF: 0.88 ml/g/min
Rest Nuclear Isotope Dose: 23.9 mCi
ST Depression (mm): 0 mm
Stress MBF: 2.46 ml/g/min
Stress Nuclear Isotope Dose: 23.9 mCi
TID: 0.93

## 2024-03-09 MED ORDER — RUBIDIUM RB82 GENERATOR (RUBYFILL)
23.9300 | PACK | Freq: Once | INTRAVENOUS | Status: AC
  Administered 2024-03-09: 23.93 via INTRAVENOUS

## 2024-03-09 MED ORDER — REGADENOSON 0.4 MG/5ML IV SOLN
0.4000 mg | Freq: Once | INTRAVENOUS | Status: AC
  Administered 2024-03-09: 0.4 mg via INTRAVENOUS

## 2024-03-09 MED ORDER — RUBIDIUM RB82 GENERATOR (RUBYFILL)
23.9300 | PACK | Freq: Once | INTRAVENOUS | Status: AC
Start: 2024-03-09 — End: 2024-03-09
  Administered 2024-03-09: 23.93 via INTRAVENOUS

## 2024-03-09 MED ORDER — REGADENOSON 0.4 MG/5ML IV SOLN
INTRAVENOUS | Status: AC
  Filled 2024-03-09: qty 5

## 2024-03-22 ENCOUNTER — Other Ambulatory Visit: Payer: Self-pay | Admitting: Cardiology

## 2024-04-26 DIAGNOSIS — Z961 Presence of intraocular lens: Secondary | ICD-10-CM | POA: Diagnosis not present

## 2024-04-26 DIAGNOSIS — H401132 Primary open-angle glaucoma, bilateral, moderate stage: Secondary | ICD-10-CM | POA: Diagnosis not present

## 2024-05-30 ENCOUNTER — Telehealth: Payer: Self-pay | Admitting: Cardiology

## 2024-05-30 NOTE — Telephone Encounter (Signed)
 Spoke with pt who stated he has been taking Pravastatin  for years and is unsure why the pharmacy is confused about it. Pt stated his PCP has taken over providing the prescription so he no longer has to bother Dr. Shlomo about it anymore. Pt was told this information would be forwarded to Dr. Shlomo to let her know. Pt verbalized understanding. All questions if any were answered.

## 2024-05-30 NOTE — Telephone Encounter (Signed)
 Pt c/o medication issue:  1. Name of Medication: pravastatin  (PRAVACHOL ) 80 MG tablet   2. How are you currently taking this medication (dosage and times per day)?   3. Are you having a reaction (difficulty breathing--STAT)? no  4. What is your medication issue? Called to say that patient has been taking this medication. Wanted to make the dr aware, states that he never picked up from pharmacy in April of this year. Please advise

## 2024-06-28 DIAGNOSIS — E1169 Type 2 diabetes mellitus with other specified complication: Secondary | ICD-10-CM | POA: Diagnosis not present

## 2024-06-28 DIAGNOSIS — I10 Essential (primary) hypertension: Secondary | ICD-10-CM | POA: Diagnosis not present

## 2024-06-28 DIAGNOSIS — E782 Mixed hyperlipidemia: Secondary | ICD-10-CM | POA: Diagnosis not present

## 2024-07-05 DIAGNOSIS — I10 Essential (primary) hypertension: Secondary | ICD-10-CM | POA: Diagnosis not present

## 2024-07-05 DIAGNOSIS — I700 Atherosclerosis of aorta: Secondary | ICD-10-CM | POA: Diagnosis not present

## 2024-07-05 DIAGNOSIS — E1169 Type 2 diabetes mellitus with other specified complication: Secondary | ICD-10-CM | POA: Diagnosis not present

## 2024-07-19 DIAGNOSIS — H401123 Primary open-angle glaucoma, left eye, severe stage: Secondary | ICD-10-CM | POA: Diagnosis not present

## 2024-07-19 DIAGNOSIS — H401112 Primary open-angle glaucoma, right eye, moderate stage: Secondary | ICD-10-CM | POA: Diagnosis not present

## 2024-07-19 DIAGNOSIS — Z961 Presence of intraocular lens: Secondary | ICD-10-CM | POA: Diagnosis not present

## 2024-12-27 ENCOUNTER — Ambulatory Visit: Admitting: Cardiology
